# Patient Record
Sex: Male | Born: 1950 | Race: White | Hispanic: No | Smoking: Former smoker
Health system: Southern US, Community
[De-identification: ages and names within clinical notes are randomized; demographics above are authoritative.]

## PROBLEM LIST (undated history)

## (undated) DIAGNOSIS — N400 Enlarged prostate without lower urinary tract symptoms: Secondary | ICD-10-CM

## (undated) DIAGNOSIS — C099 Malignant neoplasm of tonsil, unspecified: Secondary | ICD-10-CM

## (undated) HISTORY — PX: HERNIA REPAIR: SHX51

---

## 2014-09-30 ENCOUNTER — Encounter (HOSPITAL_BASED_OUTPATIENT_CLINIC_OR_DEPARTMENT_OTHER): Payer: Self-pay

## 2014-09-30 ENCOUNTER — Emergency Department (HOSPITAL_BASED_OUTPATIENT_CLINIC_OR_DEPARTMENT_OTHER): Payer: Self-pay

## 2014-09-30 ENCOUNTER — Emergency Department (HOSPITAL_BASED_OUTPATIENT_CLINIC_OR_DEPARTMENT_OTHER)
Admission: EM | Admit: 2014-09-30 | Discharge: 2014-09-30 | Disposition: A | Discharge status: Home / Self Care | Payer: Self-pay | Attending: Emergency Medicine | Admitting: Emergency Medicine

## 2014-09-30 DIAGNOSIS — R220 Localized swelling, mass and lump, head: Secondary | ICD-10-CM

## 2014-09-30 DIAGNOSIS — J358 Other chronic diseases of tonsils and adenoids: Secondary | ICD-10-CM | POA: Insufficient documentation

## 2014-09-30 DIAGNOSIS — R221 Localized swelling, mass and lump, neck: Secondary | ICD-10-CM | POA: Insufficient documentation

## 2014-09-30 DIAGNOSIS — Z720 Tobacco use: Secondary | ICD-10-CM | POA: Insufficient documentation

## 2014-09-30 HISTORY — DX: Benign prostatic hyperplasia without lower urinary tract symptoms: N40.0

## 2014-09-30 LAB — CBC WITH DIFFERENTIAL/PLATELET
Basophils Absolute: 0 10*3/uL (ref 0.0–0.1)
Basophils Relative: 0 % (ref 0–1)
Eosinophils Absolute: 0.1 10*3/uL (ref 0.0–0.7)
Eosinophils Relative: 1 % (ref 0–5)
HCT: 45.5 % (ref 39.0–52.0)
Hemoglobin: 15 g/dL (ref 13.0–17.0)
LYMPHS PCT: 27 % (ref 12–46)
Lymphs Abs: 2.1 10*3/uL (ref 0.7–4.0)
MCH: 29.9 pg (ref 26.0–34.0)
MCHC: 33 g/dL (ref 30.0–36.0)
MCV: 90.8 fL (ref 78.0–100.0)
Monocytes Absolute: 0.6 10*3/uL (ref 0.1–1.0)
Monocytes Relative: 8 % (ref 3–12)
NEUTROS ABS: 5 10*3/uL (ref 1.7–7.7)
Neutrophils Relative %: 64 % (ref 43–77)
PLATELETS: 217 10*3/uL (ref 150–400)
RBC: 5.01 MIL/uL (ref 4.22–5.81)
RDW: 13.5 % (ref 11.5–15.5)
WBC: 7.9 10*3/uL (ref 4.0–10.5)

## 2014-09-30 LAB — BASIC METABOLIC PANEL
Anion gap: 5 (ref 5–15)
BUN: 15 mg/dL (ref 6–23)
CO2: 29 mmol/L (ref 19–32)
Calcium: 9 mg/dL (ref 8.4–10.5)
Chloride: 107 mmol/L (ref 96–112)
Creatinine, Ser: 0.81 mg/dL (ref 0.50–1.35)
GFR calc Af Amer: >90 mL/min (ref >90)
Glucose, Bld: 111 mg/dL — ABNORMAL HIGH (ref 70–99)
POTASSIUM: 3.8 mmol/L (ref 3.5–5.1)
SODIUM: 141 mmol/L (ref 135–145)

## 2014-09-30 MED ORDER — IOHEXOL 300 MG/ML  SOLN
75.0000 mL | Freq: Once | INTRAMUSCULAR | Status: AC | PRN
  Administered 2014-09-30: 75 mL via INTRAVENOUS

## 2014-09-30 NOTE — ED Notes (Signed)
PA at bedside discussing results with patient

## 2014-09-30 NOTE — ED Provider Notes (Signed)
CSN: [id number]     Arrival date & time 09/30/14  1013 History   First MD Initiated Contact with Patient 09/30/14 1036     Chief Complaint  Patient presents with  . Neck Pain     (Consider location/radiation/quality/duration/timing/severity/associated sxs/prior Treatment) The history is provided by the patient.   Nathaniel Wells is a 64 y.o. male presenting with non painfull swelling to the right side of his neck which he first noticed about 3 weeks ago.  He has a mild right sided sore throat as well with swallowing that has responded to tylenol.  He denies fevers, chills, difficulty swallowing, cough, shortness of breath or chest pain.  He denies past medical history, but states he has not seen a doctor in over 8 years.  He denies surgical history.      Past Medical History  Diagnosis Date  . Enlarged prostate    History reviewed. No pertinent past surgical history. No family history on file. History  Substance Use Topics  . Smoking status: Current Every Day Smoker  . Smokeless tobacco: Not on file  . Alcohol Use: No    Review of Systems  Constitutional: Negative for fever and chills.  HENT: Positive for sore throat. Negative for congestion, facial swelling and voice change.   Eyes: Negative.   Respiratory: Negative for chest tightness and shortness of breath.   Cardiovascular: Negative for chest pain.  Gastrointestinal: Negative for nausea and abdominal pain.  Genitourinary: Negative.   Musculoskeletal: Negative for joint swelling and arthralgias.       Negative except as mentioned in HPI.    Skin: Negative.  Negative for rash and wound.  Neurological: Negative for dizziness, weakness, light-headedness, numbness and headaches.  Psychiatric/Behavioral: Negative.       Allergies  Review of patient's allergies indicates no known allergies.  Home Medications   Prior to Admission medications   Not on File   BP 159/100 mmHg  Pulse 61  Temp(Src) 97.6 F (36.4 C)  (Oral)  Resp 14  Ht 5\' 10"  (1.778 m)  Wt 144 lb (65.318 kg)  BMI 20.66 kg/m2  SpO2 100% Physical Exam  Constitutional: He is oriented to person, place, and time. He appears well-developed and well-nourished.  HENT:  Head: Normocephalic and atraumatic.  Right Ear: Tympanic membrane, external ear and ear canal normal.  Left Ear: Tympanic membrane, external ear and ear canal normal.  Nose: No mucosal edema or rhinorrhea.  Mouth/Throat: Uvula is midline and mucous membranes are normal. Posterior oropharyngeal edema present. No oropharyngeal exudate, posterior oropharyngeal erythema or tonsillar abscesses.  Right palatine tonsil slightly enlarged, no erythema or exudate.  Poor dentition, no sign of infection or abscess.  Sublingual space is soft.    Eyes: Conjunctivae are normal.  Cardiovascular: Normal rate and normal heart sounds.   Pulmonary/Chest: Effort normal. No respiratory distress. He has no wheezes. He has no rales.  Abdominal: Soft. There is no tenderness.  Musculoskeletal: Normal range of motion.  Lymphadenopathy:  Right tonsilar lymph node is enlarged, firm and fixed.  Neurological: He is alert and oriented to person, place, and time.  Skin: Skin is warm and dry. No rash noted.  Psychiatric: He has a normal mood and affect.    ED Course  Procedures (including critical care time) Labs Review Labs Reviewed  BASIC METABOLIC PANEL - Abnormal; Notable for the following:    Glucose, Bld 111 (*)    All other components within normal limits  CBC WITH DIFFERENTIAL/PLATELET  Imaging Review No results found.   EKG Interpretation None      MDM   Final diagnoses:  Neck mass  Tonsillar mass    Patients labs and/or radiological studies were reviewed and considered during the medical decision making and disposition process.  Results were also discussed with patient. Ct suggesting right tonsillar carcinoma.  Discussed this result with patient.  Call placed to [facility]  - on call physician Dr Rowe Clack.  Suggested call to the ent dept for scheduling close f/u.  Ms. Michel Harrow with the ENT dept will schedule appt for pt with first avail provider, no later than 5/12, but will attempt sooner f/u if possible.  Pt was given followup information including phone # [phone] - advised pt to call if he has not received a phone call within 48 hours.      Evalee Jefferson, PA-C 10/02/14 2135  Fredia Sorrow, MD 10/03/14 [phone]

## 2014-09-30 NOTE — ED Notes (Signed)
Ms. Nathaniel Wells is calling back from [facility] with a date and time to Nathaniel Wells [phone].

## 2014-09-30 NOTE — ED Notes (Signed)
Pt transported to and from CT.

## 2014-09-30 NOTE — ED Notes (Signed)
Reports swelling to right side of neck. Reports taking Tylenol with some relief. Sts swelling has gotten worse. Denies difficulty breathing and swallowing.

## 2014-09-30 NOTE — ED Notes (Signed)
PA at bedside.

## 2014-10-08 DIAGNOSIS — C099 Malignant neoplasm of tonsil, unspecified: Secondary | ICD-10-CM | POA: Diagnosis present

## 2014-10-08 HISTORY — DX: Malignant neoplasm of tonsil, unspecified: C09.9

## 2014-10-09 DIAGNOSIS — J439 Emphysema, unspecified: Secondary | ICD-10-CM | POA: Diagnosis present

## 2014-12-05 DIAGNOSIS — K219 Gastro-esophageal reflux disease without esophagitis: Secondary | ICD-10-CM | POA: Diagnosis present

## 2016-05-07 DIAGNOSIS — Z931 Gastrostomy status: Secondary | ICD-10-CM

## 2017-05-09 DIAGNOSIS — E7800 Pure hypercholesterolemia, unspecified: Secondary | ICD-10-CM | POA: Diagnosis present

## 2017-05-09 DIAGNOSIS — I2510 Atherosclerotic heart disease of native coronary artery without angina pectoris: Secondary | ICD-10-CM | POA: Diagnosis present

## 2017-12-27 DIAGNOSIS — Z87891 Personal history of nicotine dependence: Secondary | ICD-10-CM

## 2018-05-01 ENCOUNTER — Emergency Department (HOSPITAL_COMMUNITY): Payer: Medicare Other

## 2018-05-01 ENCOUNTER — Encounter (HOSPITAL_COMMUNITY): Payer: Self-pay | Admitting: Internal Medicine

## 2018-05-01 ENCOUNTER — Observation Stay (HOSPITAL_COMMUNITY)
Admission: EM | Admit: 2018-05-01 | Discharge: 2018-05-02 | Disposition: A | Discharge status: Home / Self Care | Payer: Medicare Other | Attending: Family Medicine | Admitting: Family Medicine

## 2018-05-01 DIAGNOSIS — Z79890 Hormone replacement therapy (postmenopausal): Secondary | ICD-10-CM | POA: Diagnosis not present

## 2018-05-01 DIAGNOSIS — E7800 Pure hypercholesterolemia, unspecified: Secondary | ICD-10-CM | POA: Diagnosis not present

## 2018-05-01 DIAGNOSIS — R42 Dizziness and giddiness: Secondary | ICD-10-CM

## 2018-05-01 DIAGNOSIS — I2510 Atherosclerotic heart disease of native coronary artery without angina pectoris: Secondary | ICD-10-CM | POA: Diagnosis present

## 2018-05-01 DIAGNOSIS — J439 Emphysema, unspecified: Secondary | ICD-10-CM | POA: Diagnosis present

## 2018-05-01 DIAGNOSIS — Z931 Gastrostomy status: Secondary | ICD-10-CM

## 2018-05-01 DIAGNOSIS — H6990 Unspecified Eustachian tube disorder, unspecified ear: Secondary | ICD-10-CM | POA: Insufficient documentation

## 2018-05-01 DIAGNOSIS — C099 Malignant neoplasm of tonsil, unspecified: Secondary | ICD-10-CM | POA: Diagnosis present

## 2018-05-01 DIAGNOSIS — Z8589 Personal history of malignant neoplasm of other organs and systems: Secondary | ICD-10-CM | POA: Insufficient documentation

## 2018-05-01 DIAGNOSIS — E039 Hypothyroidism, unspecified: Secondary | ICD-10-CM | POA: Insufficient documentation

## 2018-05-01 DIAGNOSIS — H6980 Other specified disorders of Eustachian tube, unspecified ear: Secondary | ICD-10-CM | POA: Insufficient documentation

## 2018-05-01 DIAGNOSIS — K219 Gastro-esophageal reflux disease without esophagitis: Secondary | ICD-10-CM | POA: Diagnosis not present

## 2018-05-01 DIAGNOSIS — E785 Hyperlipidemia, unspecified: Secondary | ICD-10-CM | POA: Insufficient documentation

## 2018-05-01 DIAGNOSIS — Z79899 Other long term (current) drug therapy: Secondary | ICD-10-CM | POA: Diagnosis not present

## 2018-05-01 DIAGNOSIS — J449 Chronic obstructive pulmonary disease, unspecified: Secondary | ICD-10-CM | POA: Diagnosis not present

## 2018-05-01 DIAGNOSIS — T68XXXA Hypothermia, initial encounter: Secondary | ICD-10-CM | POA: Diagnosis present

## 2018-05-01 DIAGNOSIS — R4182 Altered mental status, unspecified: Secondary | ICD-10-CM | POA: Diagnosis present

## 2018-05-01 DIAGNOSIS — R072 Precordial pain: Secondary | ICD-10-CM

## 2018-05-01 DIAGNOSIS — Z87891 Personal history of nicotine dependence: Secondary | ICD-10-CM

## 2018-05-01 DIAGNOSIS — Z7982 Long term (current) use of aspirin: Secondary | ICD-10-CM | POA: Insufficient documentation

## 2018-05-01 DIAGNOSIS — R680 Hypothermia, not associated with low environmental temperature: Secondary | ICD-10-CM | POA: Diagnosis not present

## 2018-05-01 DIAGNOSIS — R0602 Shortness of breath: Secondary | ICD-10-CM | POA: Diagnosis present

## 2018-05-01 DIAGNOSIS — F17200 Nicotine dependence, unspecified, uncomplicated: Secondary | ICD-10-CM | POA: Insufficient documentation

## 2018-05-01 HISTORY — DX: Malignant neoplasm of tonsil, unspecified: C09.9

## 2018-05-01 LAB — TROPONIN I: Troponin I: <0.03 ng/mL (ref <0.03)

## 2018-05-01 LAB — COMPREHENSIVE METABOLIC PANEL
ALBUMIN: 4.3 g/dL (ref 3.5–5.0)
ALK PHOS: 66 U/L (ref 38–126)
ALT: 30 U/L (ref 0–44)
AST: 37 U/L (ref 15–41)
Anion gap: 11 (ref 5–15)
BUN: 17 mg/dL (ref 8–23)
CO2: 25 mmol/L (ref 22–32)
Calcium: 9.2 mg/dL (ref 8.9–10.3)
Chloride: 102 mmol/L (ref 98–111)
Creatinine, Ser: 0.86 mg/dL (ref 0.61–1.24)
GFR calc Af Amer: >60 mL/min (ref >60)
GFR calc non Af Amer: >60 mL/min (ref >60)
GLUCOSE: 148 mg/dL — AB (ref 70–99)
Potassium: 3.4 mmol/L — ABNORMAL LOW (ref 3.5–5.1)
SODIUM: 138 mmol/L (ref 135–145)
Total Bilirubin: 0.3 mg/dL (ref 0.3–1.2)
Total Protein: 7 g/dL (ref 6.5–8.1)

## 2018-05-01 LAB — CBC
HCT: 39.5 % (ref 39.0–52.0)
HEMATOCRIT: 40.8 % (ref 39.0–52.0)
HEMOGLOBIN: 12.8 g/dL — AB (ref 13.0–17.0)
Hemoglobin: 12.2 g/dL — ABNORMAL LOW (ref 13.0–17.0)
MCH: 30 pg (ref 26.0–34.0)
MCH: 30.6 pg (ref 26.0–34.0)
MCHC: 31.4 g/dL (ref 30.0–36.0)
MCHC: 30.9 g/dL (ref 30.0–36.0)
MCV: 95.6 fL (ref 80.0–100.0)
MCV: 99 fL (ref 80.0–100.0)
Platelets: 168 10*3/uL (ref 150–400)
Platelets: 149 10*3/uL — ABNORMAL LOW (ref 150–400)
RBC: 4.27 MIL/uL (ref 4.22–5.81)
RBC: 3.99 MIL/uL — ABNORMAL LOW (ref 4.22–5.81)
RDW: 13 % (ref 11.5–15.5)
RDW: 13 % (ref 11.5–15.5)
WBC: 7 10*3/uL (ref 4.0–10.5)
WBC: 13.8 10*3/uL — ABNORMAL HIGH (ref 4.0–10.5)
nRBC: 0 % (ref 0.0–0.2)
nRBC: 0 % (ref 0.0–0.2)

## 2018-05-01 LAB — GLUCOSE, CAPILLARY: Glucose-Capillary: 96 mg/dL (ref 70–99)

## 2018-05-01 LAB — RAPID URINE DRUG SCREEN, HOSP PERFORMED
Amphetamines: NOT DETECTED
Barbiturates: NOT DETECTED
Benzodiazepines: NOT DETECTED
Cocaine: NOT DETECTED
Opiates: NOT DETECTED
Tetrahydrocannabinol: POSITIVE — AB

## 2018-05-01 LAB — URINALYSIS, ROUTINE W REFLEX MICROSCOPIC
Bilirubin Urine: NEGATIVE
Glucose, UA: NEGATIVE mg/dL
Hgb urine dipstick: NEGATIVE
Ketones, ur: NEGATIVE mg/dL
Leukocytes, UA: NEGATIVE
Nitrite: NEGATIVE
Protein, ur: NEGATIVE mg/dL
Specific Gravity, Urine: 1.013 (ref 1.005–1.030)
pH: 8 (ref 5.0–8.0)

## 2018-05-01 LAB — LIPASE, BLOOD: Lipase: 29 U/L (ref 11–51)

## 2018-05-01 LAB — SALICYLATE LEVEL: Salicylate Lvl: <7 mg/dL (ref 2.8–30.0)

## 2018-05-01 LAB — TSH: TSH: 3.554 u[IU]/mL (ref 0.350–4.500)

## 2018-05-01 LAB — I-STAT TROPONIN, ED: TROPONIN I, POC: 0 ng/mL (ref 0.00–0.08)

## 2018-05-01 LAB — T4, FREE: Free T4: 0.89 ng/dL (ref 0.82–1.77)

## 2018-05-01 LAB — I-STAT CG4 LACTIC ACID, ED
Lactic Acid, Venous: 1.64 mmol/L (ref 0.5–1.9)
Lactic Acid, Venous: 1.49 mmol/L (ref 0.5–1.9)

## 2018-05-01 LAB — INFLUENZA PANEL BY PCR (TYPE A & B)
Influenza A By PCR: NEGATIVE
Influenza B By PCR: NEGATIVE

## 2018-05-01 MED ORDER — ADULT MULTIVITAMIN W/MINERALS CH
1.0000 | ORAL_TABLET | Freq: Every day | ORAL | Status: DC
  Administered 2018-05-02: 1 via ORAL
  Filled 2018-05-01: qty 1

## 2018-05-01 MED ORDER — SODIUM CHLORIDE 0.9 % IV BOLUS
500.0000 mL | Freq: Once | INTRAVENOUS | Status: AC
  Administered 2018-05-01: 500 mL via INTRAVENOUS

## 2018-05-01 MED ORDER — UMECLIDINIUM BROMIDE 62.5 MCG/INH IN AEPB
1.0000 | INHALATION_SPRAY | Freq: Every day | RESPIRATORY_TRACT | Status: DC
  Administered 2018-05-02: 1 via RESPIRATORY_TRACT
  Filled 2018-05-01: qty 7

## 2018-05-01 MED ORDER — IPRATROPIUM-ALBUTEROL 0.5-2.5 (3) MG/3ML IN SOLN: 3.0000 mL | Freq: Four times a day (QID) | RESPIRATORY_TRACT | Status: DC | PRN

## 2018-05-01 MED ORDER — PANTOPRAZOLE SODIUM 40 MG PO TBEC
40.0000 mg | DELAYED_RELEASE_TABLET | Freq: Two times a day (BID) | ORAL | Status: DC
  Administered 2018-05-01 – 2018-05-02 (×2): 40 mg via ORAL
  Filled 2018-05-01 (×2): qty 1

## 2018-05-01 MED ORDER — IPRATROPIUM-ALBUTEROL 0.5-2.5 (3) MG/3ML IN SOLN
3.0000 mL | Freq: Once | RESPIRATORY_TRACT | Status: AC
  Administered 2018-05-01: 3 mL via RESPIRATORY_TRACT
  Filled 2018-05-01: qty 3

## 2018-05-01 MED ORDER — ENOXAPARIN SODIUM 40 MG/0.4ML ~~LOC~~ SOLN
40.0000 mg | Freq: Every day | SUBCUTANEOUS | Status: DC
  Administered 2018-05-01: 40 mg via SUBCUTANEOUS
  Filled 2018-05-01: qty 0.4

## 2018-05-01 MED ORDER — ONDANSETRON HCL 4 MG/2ML IJ SOLN: 4.0000 mg | Freq: Four times a day (QID) | INTRAMUSCULAR | Status: DC | PRN

## 2018-05-01 MED ORDER — ONDANSETRON HCL 4 MG/2ML IJ SOLN
4.0000 mg | Freq: Once | INTRAMUSCULAR | Status: AC | PRN
  Administered 2018-05-01: 4 mg via INTRAVENOUS
  Filled 2018-05-01: qty 2

## 2018-05-01 MED ORDER — OSMOLITE 1.5 CAL PO LIQD
474.0000 mL | Freq: Three times a day (TID) | ORAL | Status: DC
  Filled 2018-05-01 (×4): qty 474

## 2018-05-01 MED ORDER — SIMVASTATIN 20 MG PO TABS
40.0000 mg | ORAL_TABLET | Freq: Every evening | ORAL | Status: DC
  Administered 2018-05-01: 40 mg via ORAL
  Filled 2018-05-01: qty 1
  Filled 2018-05-01: qty 2

## 2018-05-01 MED ORDER — ASPIRIN 81 MG PO CHEW
81.0000 mg | CHEWABLE_TABLET | Freq: Every day | ORAL | Status: DC
  Filled 2018-05-01: qty 1

## 2018-05-01 MED ORDER — ALBUTEROL SULFATE (2.5 MG/3ML) 0.083% IN NEBU: 3.0000 mL | INHALATION_SOLUTION | RESPIRATORY_TRACT | Status: DC | PRN

## 2018-05-01 MED ORDER — PILOCARPINE HCL 5 MG PO TABS
7.5000 mg | ORAL_TABLET | Freq: Three times a day (TID) | ORAL | Status: DC
  Administered 2018-05-01 – 2018-05-02 (×3): 7.5 mg via ORAL
  Filled 2018-05-01 (×4): qty 2

## 2018-05-01 MED ORDER — TIOTROPIUM BROMIDE MONOHYDRATE 18 MCG IN CAPS
18.0000 ug | ORAL_CAPSULE | Freq: Every day | RESPIRATORY_TRACT | Status: DC
  Filled 2018-05-01: qty 5

## 2018-05-01 MED ORDER — LEVOTHYROXINE SODIUM 25 MCG PO TABS
25.0000 ug | ORAL_TABLET | Freq: Every day | ORAL | Status: DC
  Administered 2018-05-01: 25 ug via ORAL
  Filled 2018-05-01 (×2): qty 1

## 2018-05-01 MED ORDER — FLUOCINONIDE 0.05 % EX SOLN
1.0000 "application " | Freq: Two times a day (BID) | CUTANEOUS | Status: DC | PRN
  Filled 2018-05-01: qty 60

## 2018-05-01 NOTE — ED Notes (Signed)
Per hospitalist, keep patient on bear hugger medium setting. Pt reports that he feels better on this higher setting. Admit status changed from observation to stepdown to accommodate bear hugger and 96.7 rectal temperature.

## 2018-05-01 NOTE — ED Notes (Signed)
Patient transported to X-ray 

## 2018-05-01 NOTE — H&P (Signed)
History and Physical    Nathaniel Wells UMP:[id number] DOB: 26-May-1951 DOA: 05/01/2018  PCP: No primary care provider on file. Consultants: None Patient coming from: Home- lives alone  Chief Complaint: Vertigo  HPI: Nathaniel Wells is a 67 y.o. 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  Patient states that when he woke up this morning he was feeling in his usual state of health.  He got in his car to drive to see his daughter at work and states that while he was on the way he began to feel vertigo and general malaise.  He states that about every 6 months for the last 10 or 15 years he gets a spell of vertigo that lasts anywhere from 2 to 6 hours that is relieved with lying in bed and resting.  This felt similar to those episodes.  However, when his daughter saw that his car had arrived in the parking lot she went out to see him and found him lying back reclined in his seat and thought he was asleep.  When she opened the door he was awake but had decreased alertness, breathing hard, "his eyes were not focused" and he reportedly was complaining of chest pain.  Patient does not recall complaining of any chest pain.  He has not had any shortness of breath.  He does not appear to remember the details of what happened when his daughter came out to the car.  He has not had any fevers or chills at home.  Although his daughter states that he feels warm on occasion.  He is G-tube-dependent for his nutrition but is able to drink fluids.  He states that he is compliant with all his medications.  He quit smoking several years ago but still smokes marijuana according to his daughter.  She states that he started smoking marijuana around the time of his cancer treatment and  she does not believe he uses it excessively.  He does not drink any alcohol.  He takes a baby aspirin daily.  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  This was felt to be the cause of his vertigo.   ED Course: Rectal temperature was 96.4, O2 sats 97% on room air, blood pressure 133/68, heart rate 48 on arrival which improved to 50s to 70s.  Respiratory rate 14-23.  When patient arrived in the ED he was having rigors, complaining of being cold.  Vertigo resolved by the time of my exam.  He reported no pain.  Lab work was unremarkable.  Lactate was within normal limits.  Head CT showed nothing acute, chest x-ray was unremarkable.  Blood cultures were sent. Bair Hugger was placed which pt stated made him feel better, and he became more alert. However repeat rectal temp was 96.7.   Review of Systems: As per HPI; otherwise review of systems reviewed and negative.   Ambulatory Status:  Ambulates without assistance  Past Medical History:  Diagnosis Date  . Enlarged prostate     History reviewed. No pertinent surgical history.  Social History   Socioeconomic History  . Marital status: Unknown    Spouse name: Not on file  . Number  of children: Not on file  . Years of education: Not on file  . Highest education level: Not on file  Occupational History  . Not on file  Social Needs  . Financial resource strain: Not on file  . Food insecurity:    Worry: Not on file    Inability: Not on file  . Transportation needs:    Medical: Not on file    Non-medical: Not on file  Tobacco Use  . Smoking status: Current Every Day Smoker  Substance and Sexual Activity  . Alcohol use: No  . Drug use: Not on file  . Sexual activity: Not on file  Lifestyle  . Physical activity:    Days per week: Not on file    Minutes per session: Not on  file  . Stress: Not on file  Relationships  . Social connections:    Talks on phone: Not on file    Gets together: Not on file    Attends religious service: Not on file    Active member of club or organization: Not on file    Attends meetings of clubs or organizations: Not on file    Relationship status: Not on file  . Intimate partner violence:    Fear of current or ex partner: Not on file    Emotionally abused: Not on file    Physically abused: Not on file    Forced sexual activity: Not on file  Other Topics Concern  . Not on file  Social History Narrative  . Not on file    No Known Allergies  No family history on file.  Prior to Admission medications   Medication Sig Start Date End Date Taking? Authorizing Provider  albuterol (PROVENTIL HFA;VENTOLIN HFA) 108 (90 Base) MCG/ACT inhaler Inhale 2 puffs into the lungs every 4 (four) hours as needed for wheezing or shortness of breath. 04/05/18  Yes [provider]  aspirin 81 MG chewable tablet Chew 81 mg by mouth daily. 05/09/17  Yes [provider]  esomeprazole (NEXIUM) 40 MG capsule Take 40 mg by mouth 2 (two) times daily. 03/23/18  Yes [provider]  fluocinonide (LIDEX) 0.05 % external solution Apply 1 application topically 2 (two) times daily as needed. For flare ups 03/31/18  Yes [provider]  levothyroxine (SYNTHROID, LEVOTHROID) 25 MCG tablet Take 25 mcg by mouth daily. 05/09/17  Yes [provider]  Multiple Vitamin (MULTIVITAMIN WITH MINERALS) TABS tablet Take 1 tablet by mouth daily.   Yes [provider]  pilocarpine (SALAGEN) 7.5 MG tablet Take 7.5 mg by mouth 3 (three) times daily.  04/26/18  Yes [provider]  simvastatin (ZOCOR) 40 MG tablet Take 40 mg by mouth every evening. 04/28/18  Yes [provider]  tiotropium (SPIRIVA) 18 MCG inhalation capsule Place 18 mcg into inhaler and inhale daily.   Yes [provider]    Physical  Exam: Vitals:   05/01/18 1515 05/01/18 1530 05/01/18 1545 05/01/18 1600  BP: (!) 135/56  134/90 140/65  Pulse: (!) 51 (!) 56 (!) 51 71  Resp: 14 17 19  (!) 23  Temp:      TempSrc:      SpO2: 99% 100% 98% 100%     . General: Appears calm and comfortable and is in NAD. Bair Hugger in place. Alert and oriented x 4. . Eyes:  PERRL, EOMI, normal lids, iris . ENT:  grossly normal hearing, lips & tongue. MMM. . Neck:  supple, no lymphadenopathy .  Cardiovascular:  nL S1, S2, normal rate, reg rhythm, no murmur. Marland Kitchen Respiratory:   CTA bilaterally with no wheezes/rales/rhonchi.  Normal respiratory effort. . Abdomen:  soft, NT, ND, NABS . Back:   grossly normal alignment . Skin:  no rash or lesions seen on limited exam . Musculoskeletal: Muscle atrophy BUE/BLE, good ROM, no bony abnormality or obvious joint deformity . Lower extremities:  No LE edema.  Limited foot exam with no ulcerations.  2+ distal pulses. Marland Kitchen Psychiatric:  grossly normal mood and affect, speech fluent and appropriate, AOx3 . Neurologic:  CN 2-12 grossly intact, moves all extremities in coordinated fashion, sensation intact, Patellar DTRs 2+ and symmetric    Radiological Exams on Admission: Dg Chest 2 View  Result Date: 05/01/2018 CLINICAL DATA:  Shortness of breath and dizziness. EXAM: CHEST - 2 VIEW COMPARISON:  None. FINDINGS: Cardiomediastinal silhouette is normal. Mediastinal contours appear intact. Calcific atherosclerotic disease and tortuosity of the aorta. There is no evidence of focal airspace consolidation, pleural effusion or pneumothorax. Biapical subpleural thickening. Osseous structures are without acute abnormality. Soft tissues are grossly normal. IMPRESSION: No evidence of pulmonary edema or infiltrate. Biapical subpleural thickening, right greater than left. Given patient's history of smoking, further evaluation with CT of the chest may be considered on a nonemergent basis. Calcific atherosclerotic disease of the  aorta. Electronically Signed   By: Fidela Salisbury M.D.   On: 05/01/2018 15:51   Ct Head Wo Contrast  Result Date: 05/01/2018 CLINICAL DATA:  Altered mental status, dizziness. EXAM: CT HEAD WITHOUT CONTRAST TECHNIQUE: Contiguous axial images were obtained from the base of the skull through the vertex without intravenous contrast. COMPARISON:  None. FINDINGS: Brain: There is mild generalized parenchymal volume loss with commensurate dilatation of the ventricles and sulci. Mild chronic small vessel ischemic changes noted within the periventricular white matter regions bilaterally. There is no mass, hemorrhage, edema or other evidence of acute parenchymal abnormality. No extra-axial hemorrhage. Vascular: Chronic calcified atherosclerotic changes of the large vessels at the skull base. No unexpected hyperdense vessel. Skull: Normal. Negative for fracture or focal lesion. Sinuses/Orbits: No acute finding. Other: None. IMPRESSION: 1. No acute findings. No intracranial mass, hemorrhage or edema. 2. Mild chronic small vessel ischemic changes in the white matter. Electronically Signed   By: Franki Cabot M.D.   On: 05/01/2018 16:11    EKG: Independently reviewed.    EKG Interpretation  Date/Time:                        Monday May 01 2018 14:37:58 EST Ventricular Rate:   54 PR Interval:                        QRS Duration:        90 QT Interval:                      498 QTC Calculation:    472 R Axis:                         80 Text Interpretation:  Sinus rhythm Atrial premature complex No STEMI. No old tracing for comparison.  Confirmed by Nanda Quinton [phone]) on 05/01/2018 3:32:49 PM Also confirmed by Nanda Quinton [phone]), editor Philomena Doheny [phone])  on 05/01/2018 3:35:04 PM    Labs on Admission: I have personally reviewed the available labs and imaging studies at the time of the  admission.  Pertinent labs:  Sodium 138 potassium 3.4 chloride 102 CO2 25 glucose 148 BUN 17 creatinine 0.86  which is his baseline, calcium 9.2, LFTs within normal limits Troponin 0.00 Lactic acid 1.64 WBC 7.0, hemoglobin 12.8, platelets 168 U/A ordered, pending collection  Assessment/Plan Active Problems:   Atherosclerosis of native coronary artery of native heart without angina pectoris   Emphysema of lung (HCC)   Feeding by G-tube ([place])   Gastroesophageal reflux disease without esophagitis   Former heavy tobacco smoker   Squamous cell carcinoma of right tonsil (HCC)   Hypercholesterolemia    Hypothermia: mild, no other s/s sepsis. CXR clear, no evidence of PNA. Urine has not been collected, ?UTI?  Alternatively, could be 2/2 hyopthyroidism, malnutrition, thiamine deficiency, hypoglycemia (the latter less likely as pt has normal glucose here); he does have a h/o high/normal TSH / undertreated hypothyroidism in the past. He is not on any meds typically associated with hypothermia. -cont Bair Hugger, frequent vitals -admit to stepdown unit as pt requiring Bair Hugger -check TSH, FT4 -check urine drug screen, aspirin level, thiamine level -accuchecks to r/o hypoglycemia -f/u U/A  Vertigo: likely related to his chronic eustachian tube dysfunction, question Meniere's given concomitant nausea and h/o SNHL. -meclizine prn -currently resolved -no evidence of CVA   CAD: stable. No evidence of ACS. Tropnin negative x 1. EKG no ischemia -cycle troponin, EKG in a.m. -continue   Hypothyroidism: as above -check TSH, FT4 -cont synthroid  COPD -cont albuterol nebs prn, Ellipta   Hyperlipidemia: cont home statin  History of squamous cell carcinoma of the tonsil, G-tube dependent -osmolite 1.5 2 cans TID -pt is able to drink liquids -cont outpatient followup  GERD: cont PPI  DVT prophylaxis: lovenox [place] Code Status:  Full - confirmed with patient/family Family Communication: daughters at bedside  Disposition Plan:  Home once clinically improved Consults called: none  Admission status:  Admit - It is my clinical opinion that admission to INPATIENT is reasonable and necessary because of the expectation that this patient will require hospital care that crosses at least 2 midnights to treat this condition based on the medical complexity of the problems presented.  Given the aforementioned information, the predictability of an adverse outcome is felt to be significant.     Janora Norlander MD [facility]  If note is complete, please contact covering daytime or nighttime physician. [email or web address] Password [email or web address]  05/01/2018, 5:25 PM

## 2018-05-01 NOTE — ED Notes (Signed)
Patient given warm blankets. Pt also had 1 more episode of vomiting with this RN.

## 2018-05-01 NOTE — ED Notes (Signed)
Patient placed on bear hugger blanket d/t 96.4 rectal temperature.

## 2018-05-01 NOTE — ED Notes (Signed)
Hospitalist at the bedside 

## 2018-05-01 NOTE — ED Triage Notes (Addendum)
Pt here after daughter called EMS from [employer] parking lot due to pt's shortness of breath and dizziness. Lung sounds clear for EMS. Pt had episode of emesis (266mls of osmolite). States he had some chest pain initially that has subsided now. Hx vertigo, feeding tube, throat cancer, and COPD. 97% on RA for EMS. Pt able to speak in full sentences without difficulty.

## 2018-05-01 NOTE — ED Provider Notes (Signed)
Emergency Department Provider Note   I have reviewed the triage vital signs and the nursing notes.   HISTORY  Chief Complaint Shortness of Breath   HPI Nathaniel Wells is a 67 y.o. male with PMH of BPH, COPD and vertigo resents to the emergency department for evaluation of shortness of breath, chest tightness, lightheadedness/vertigo symptoms.  The patient's daughter works at [employer].  The patient was driving to visit her at work.  States he is having some mild shortness of breath prior to leaving home.  He arrived to the store and parked his car.  He felt sudden onset lightheadedness/vertigo symptoms which are typical of his prior vertigo but had associated shortness of breath and chest heaviness which is atypical.  His daughter knew he was coming to visit and walked out to see his car parked there.  When she hooked up to the car he was laying down in the car and shivering.  He is complaining of shortness of breath and chest pressure.  Patient states that many of the symptoms have resolved at this point.  He has been feeling okay up until this point in the day.  No active chest pressure but continues to have some shortness of breath.  He does not take medication for vertigo.  Denies any weakness or numbness.  Patient did have 2 episodes of vomiting but denies abdominal pain.    Past Medical History:  Diagnosis Date  . Enlarged prostate   . 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 Eustachian tube dysfunction 05/01/2018  . Vertigo 05/01/2018  . Hypothermia 05/01/2018  . Former heavy tobacco smoker 12/27/2017  . Atherosclerosis of native coronary artery of native heart without angina pectoris 05/09/2017  . Hypercholesterolemia 05/09/2017  . Feeding by G-tube ([place]) 05/07/2016  . Gastroesophageal reflux disease without esophagitis 12/05/2014  . Emphysema of lung ([place]) 10/09/2014  . Squamous cell carcinoma of right  tonsil ([place]) 10/08/2014    History reviewed. No pertinent surgical history.    Allergies Patient has no known allergies.  No family history on file.  Social History Social History   Tobacco Use  . Smoking status: Current Every Day Smoker  Substance Use Topics  . Alcohol use: No  . Drug use: Not on file    Review of Systems  Constitutional: No fever. Positive chills.  Eyes: No visual changes. ENT: No sore throat. Positive vertigo.  Cardiovascular: Positive chest pain. Respiratory: Positive shortness of breath. Gastrointestinal: No abdominal pain. Positive nausea and vomiting.  No diarrhea.  No constipation. Genitourinary: Negative for dysuria. Musculoskeletal: Negative for back pain. Skin: Negative for rash. Neurological: Negative for headaches, focal weakness or numbness.  10-point ROS otherwise negative.  ____________________________________________   PHYSICAL EXAM:  VITAL SIGNS: ED Triage Vitals  Enc Vitals Group     BP 05/01/18 1431 133/68     Pulse --      Resp --      Temp 05/01/18 1444 (S) (!) 96.4 F (35.8 C)     Temp Source 05/01/18 1444 Rectal     SpO2 05/01/18 1431 97 %     Pain Score 05/01/18 1437 0   Constitutional: Alert and oriented. Appears generally weak but in no acute distress. No AMS.  Eyes: Conjunctivae are normal.  Head: Atraumatic. Nose: No congestion/rhinnorhea. Mouth/Throat: Mucous membranes are dry.  Neck: No stridor. Cardiovascular: Normal rate, regular rhythm. Good peripheral circulation. Grossly normal  heart sounds.   Respiratory: Normal respiratory effort.  No retractions. Lungs with mild bilateral wheezing. No rales or rhonchi.  Gastrointestinal: Soft and nontender. No distention.  Musculoskeletal: No lower extremity tenderness nor edema. No gross deformities of extremities. Neurologic:  Normal speech and language. No gross focal neurologic deficits are appreciated.  Skin:  Skin is warm, dry and intact. No rash  noted.  ____________________________________________   LABS (all labs ordered are listed, but only abnormal results are displayed)  Labs Reviewed  CBC - Abnormal; Notable for the following components:      Result Value   Hemoglobin 12.8 (*)    All other components within normal limits  COMPREHENSIVE METABOLIC PANEL - Abnormal; Notable for the following components:   Potassium 3.4 (*)    Glucose, Bld 148 (*)    All other components within normal limits  URINALYSIS, ROUTINE W REFLEX MICROSCOPIC - Abnormal; Notable for the following components:   APPearance HAZY (*)    All other components within normal limits  RAPID URINE DRUG SCREEN, HOSP PERFORMED - Abnormal; Notable for the following components:   Tetrahydrocannabinol POSITIVE (*)    All other components within normal limits  CBC - Abnormal; Notable for the following components:   WBC 13.8 (*)    RBC 3.99 (*)    Hemoglobin 12.2 (*)    Platelets 149 (*)    All other components within normal limits  CULTURE, BLOOD (ROUTINE X 2)  CULTURE, BLOOD (ROUTINE X 2)  MRSA PCR SCREENING  LIPASE, BLOOD  INFLUENZA PANEL BY PCR (TYPE A & B)  TSH  SALICYLATE LEVEL  HIV ANTIBODY (ROUTINE TESTING W REFLEX)  BASIC METABOLIC PANEL  TROPONIN I  TROPONIN I  T4, FREE  GLUCOSE, CAPILLARY  GLUCOSE, CAPILLARY  VITAMIN B1  I-STAT TROPONIN, ED  I-STAT CG4 LACTIC ACID, ED  I-STAT CG4 LACTIC ACID, ED   ____________________________________________  EKG   EKG Interpretation  Date/Time:  Monday May 01 2018 14:37:58 EST Ventricular Rate:  54 PR Interval:    QRS Duration: 90 QT Interval:  498 QTC Calculation: 472 R Axis:   80 Text Interpretation:  Sinus rhythm Atrial premature complex No STEMI. No old tracing for comparison.  Confirmed by Nanda Quinton [phone]) on 05/01/2018 3:32:49 PM Also confirmed by Nanda Quinton [phone]), editor Philomena Doheny [phone])  on 05/01/2018 3:35:04 PM        ____________________________________________  RADIOLOGY  Dg Chest 2 View  Result Date: 05/01/2018 CLINICAL DATA:  Shortness of breath and dizziness. EXAM: CHEST - 2 VIEW COMPARISON:  None. FINDINGS: Cardiomediastinal silhouette is normal. Mediastinal contours appear intact. Calcific atherosclerotic disease and tortuosity of the aorta. There is no evidence of focal airspace consolidation, pleural effusion or pneumothorax. Biapical subpleural thickening. Osseous structures are without acute abnormality. Soft tissues are grossly normal. IMPRESSION: No evidence of pulmonary edema or infiltrate. Biapical subpleural thickening, right greater than left. Given patient's history of smoking, further evaluation with CT of the chest may be considered on a nonemergent basis. Calcific atherosclerotic disease of the aorta. Electronically Signed   By: Fidela Salisbury M.D.   On: 05/01/2018 15:51   Ct Head Wo Contrast  Result Date: 05/01/2018 CLINICAL DATA:  Altered mental status, dizziness. EXAM: CT HEAD WITHOUT CONTRAST TECHNIQUE: Contiguous axial images were obtained from the base of the skull through the vertex without intravenous contrast. COMPARISON:  None. FINDINGS: Brain: There is mild generalized parenchymal volume loss with commensurate dilatation of the ventricles and sulci. Mild chronic small vessel ischemic  changes noted within the periventricular white matter regions bilaterally. There is no mass, hemorrhage, edema or other evidence of acute parenchymal abnormality. No extra-axial hemorrhage. Vascular: Chronic calcified atherosclerotic changes of the large vessels at the skull base. No unexpected hyperdense vessel. Skull: Normal. Negative for fracture or focal lesion. Sinuses/Orbits: No acute finding. Other: None. IMPRESSION: 1. No acute findings. No intracranial mass, hemorrhage or edema. 2. Mild chronic small vessel ischemic changes in the white matter. Electronically Signed   By: Franki Cabot  M.D.   On: 05/01/2018 16:11    ____________________________________________   PROCEDURES  Procedure(s) performed:   Procedures  None ____________________________________________   INITIAL IMPRESSION / ASSESSMENT AND PLAN / ED COURSE  Pertinent labs & imaging results that were available during my care of the patient were reviewed by me and considered in my medical decision making (see chart for details).  Patient presents to the emergency department for evaluation of generalized weakness, shortness of breath, chest pressure, and vertigo symptoms.  Patient appears somewhat fatigued and tired but is able to provide a full history.  No active chest pain.  No evidence of acute ischemia on EKG.  In the emergency department.  The patient has a low temperature on our initial evaluation.  Nursing report rigors. Will continue warming and f/u with labs and imaging including CT head with acute onset vertigo although the patient does have a vertigo history. Peripheral vertigo would not explain patient's CP/SOB and low core temp.   Labs and imaging reviewed. No infection source at this time. Patient feeling improved. Will admit for obs and CP evaluation with further cardiac monitoring.   Discussed patient's case with Hospitalist to request admission. Patient and family (if present) updated with plan. Care transferred to Hospitalist service.  I reviewed all nursing notes, vitals, pertinent old records, EKGs, labs, imaging (as available).  ____________________________________________  FINAL CLINICAL IMPRESSION(S) / ED DIAGNOSES  Final diagnoses:  Precordial chest pain  Vertigo  SOB (shortness of breath)  Hypothermia, initial encounter     MEDICATIONS GIVEN DURING THIS VISIT:  Medications  ondansetron (ZOFRAN) injection 4 mg (has no administration in time range)  ipratropium-albuterol (DUONEB) 0.5-2.5 (3) MG/3ML nebulizer solution 3 mL (has no administration in time range)  multivitamin  with minerals tablet 1 tablet (1 tablet Oral Given 05/02/18 0809)  albuterol (PROVENTIL) (2.5 MG/3ML) 0.083% nebulizer solution 3 mL (has no administration in time range)  aspirin chewable tablet 81 mg (81 mg Oral Not Given 05/02/18 0808)  pantoprazole (PROTONIX) EC tablet 40 mg (40 mg Oral Given 05/02/18 0809)  fluocinonide (LIDEX) 0.05 % external solution 1 application (has no administration in time range)  levothyroxine (SYNTHROID, LEVOTHROID) tablet 25 mcg (25 mcg Oral Not Given 05/02/18 0930)  pilocarpine (SALAGEN) tablet 7.5 mg (7.5 mg Oral Given 05/02/18 0809)  simvastatin (ZOCOR) tablet 40 mg (40 mg Oral Given 05/01/18 2225)  enoxaparin (LOVENOX) injection 40 mg (40 mg Subcutaneous Given 05/01/18 2237)  feeding supplement (OSMOLITE 1.5 CAL) liquid 474 mL (474 mLs Per Tube Not Given 05/01/18 2231)  umeclidinium bromide (INCRUSE ELLIPTA) 62.5 MCG/INH 1 puff (1 puff Inhalation Given 05/02/18 0906)  MEDLINE mouth rinse (has no administration in time range)  chlorhexidine (PERIDEX) 0.12 % solution 15 mL (has no administration in time range)  ondansetron (ZOFRAN) injection 4 mg (4 mg Intravenous Given 05/01/18 1458)  sodium chloride 0.9 % bolus 500 mL (0 mLs Intravenous Stopped 05/01/18 1703)  ipratropium-albuterol (DUONEB) 0.5-2.5 (3) MG/3ML nebulizer solution 3 mL (3 mLs Nebulization Given  05/01/18 1517)    Note:  This document was prepared using Dragon voice recognition software and may include unintentional dictation errors.  Nanda Quinton, MD Emergency Medicine    Brieann Osinski, [facility], MD 05/02/18 [phone]

## 2018-05-02 ENCOUNTER — Encounter (HOSPITAL_COMMUNITY): Payer: Self-pay

## 2018-05-02 ENCOUNTER — Other Ambulatory Visit: Payer: Self-pay

## 2018-05-02 DIAGNOSIS — J439 Emphysema, unspecified: Secondary | ICD-10-CM

## 2018-05-02 DIAGNOSIS — T68XXXA Hypothermia, initial encounter: Secondary | ICD-10-CM

## 2018-05-02 DIAGNOSIS — R4182 Altered mental status, unspecified: Secondary | ICD-10-CM

## 2018-05-02 DIAGNOSIS — Z87891 Personal history of nicotine dependence: Secondary | ICD-10-CM

## 2018-05-02 DIAGNOSIS — R42 Dizziness and giddiness: Secondary | ICD-10-CM | POA: Diagnosis not present

## 2018-05-02 DIAGNOSIS — I2510 Atherosclerotic heart disease of native coronary artery without angina pectoris: Secondary | ICD-10-CM | POA: Diagnosis not present

## 2018-05-02 DIAGNOSIS — C099 Malignant neoplasm of tonsil, unspecified: Secondary | ICD-10-CM

## 2018-05-02 DIAGNOSIS — Z931 Gastrostomy status: Secondary | ICD-10-CM | POA: Diagnosis not present

## 2018-05-02 DIAGNOSIS — R680 Hypothermia, not associated with low environmental temperature: Secondary | ICD-10-CM | POA: Diagnosis not present

## 2018-05-02 DIAGNOSIS — K219 Gastro-esophageal reflux disease without esophagitis: Secondary | ICD-10-CM

## 2018-05-02 DIAGNOSIS — E7800 Pure hypercholesterolemia, unspecified: Secondary | ICD-10-CM

## 2018-05-02 DIAGNOSIS — E039 Hypothyroidism, unspecified: Secondary | ICD-10-CM | POA: Diagnosis not present

## 2018-05-02 LAB — BASIC METABOLIC PANEL
Anion gap: 8 (ref 5–15)
BUN: 13 mg/dL (ref 8–23)
CO2: 26 mmol/L (ref 22–32)
Calcium: 8.9 mg/dL (ref 8.9–10.3)
Chloride: 104 mmol/L (ref 98–111)
Creatinine, Ser: 0.81 mg/dL (ref 0.61–1.24)
GFR calc Af Amer: >60 mL/min (ref >60)
GFR calc non Af Amer: >60 mL/min (ref >60)
Glucose, Bld: 83 mg/dL (ref 70–99)
Potassium: 3.7 mmol/L (ref 3.5–5.1)
Sodium: 138 mmol/L (ref 135–145)

## 2018-05-02 LAB — CBC
HEMATOCRIT: 37.6 % — AB (ref 39.0–52.0)
HEMOGLOBIN: 11.9 g/dL — AB (ref 13.0–17.0)
MCH: 30 pg (ref 26.0–34.0)
MCHC: 31.6 g/dL (ref 30.0–36.0)
MCV: 94.7 fL (ref 80.0–100.0)
Platelets: 146 10*3/uL — ABNORMAL LOW (ref 150–400)
RBC: 3.97 MIL/uL — ABNORMAL LOW (ref 4.22–5.81)
RDW: 13.1 % (ref 11.5–15.5)
WBC: 8.7 10*3/uL (ref 4.0–10.5)
nRBC: 0 % (ref 0.0–0.2)

## 2018-05-02 LAB — MRSA PCR SCREENING: MRSA by PCR: NEGATIVE

## 2018-05-02 LAB — GLUCOSE, CAPILLARY
Glucose-Capillary: 99 mg/dL (ref 70–99)
Glucose-Capillary: 165 mg/dL — ABNORMAL HIGH (ref 70–99)

## 2018-05-02 LAB — TROPONIN I: Troponin I: <0.03 ng/mL (ref <0.03)

## 2018-05-02 LAB — HIV ANTIBODY (ROUTINE TESTING W REFLEX): HIV Screen 4th Generation wRfx: NONREACTIVE

## 2018-05-02 MED ORDER — CHLORHEXIDINE GLUCONATE 0.12 % MT SOLN: 15.0000 mL | Freq: Two times a day (BID) | OROMUCOSAL | Status: DC

## 2018-05-02 MED ORDER — ORAL CARE MOUTH RINSE
15.0000 mL | Freq: Two times a day (BID) | OROMUCOSAL | Status: DC
  Administered 2018-05-02 (×2): 15 mL via OROMUCOSAL

## 2018-05-02 NOTE — Discharge Summary (Addendum)
Physician Discharge Summary  Nathaniel Wells QMV:[id number] DOB: 07-13-1950 DOA: 05/01/2018  PCP: Deborah Chalk, FNP  Admit date: 05/01/2018 Discharge date: 05/02/2018  Admitted From: Home Disposition: Home   Recommendations for Outpatient Follow-up:  1. Follow up with PCP in 1-2 weeks. Strict return precautions advised. 2. Please follow up on the following pending results: Blood cultures (no growth x24 hours). 3. Pleural thickening noted on CXR at admission (see report below and image in PACS). Unclear significance. Radiology recommends follow up. This was shared with the patient and should be addressed by PCP at follow up.  Home Health: None Equipment/Devices: None Discharge Condition: Stable CODE STATUS: Full Diet recommendation: Heart healthy  Brief/Interim Summary: Nathaniel Wells is a 67 y.o. 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 After feeling no evidence of sickness that morning, he drove to pick up his daughter who came to his parked car and found him laying back in the driver's seat, complaining of these symptoms and malaise that developed during the car ride. He had glossy eyes but remained slowly responsive throughout. No seizure-like activity was noted. She brought him to the ED for evaluation where he was complaining of chills found to have hypothermia (rectal 96.26F, intermittent mild bradycardia 48-70bpm, normal respiratory rate and BP, no hypoxia. Dizziness, nausea and vomiting had resolved. Labs were unremarkable, head CT and CXR both unremarkable. Bair hugger was placed with improvement in hypothermia and AMS and he was brought into the hospital after blood cultures were drawn.  He states that  about every 6 months for the last 10 or 15 years he gets a spell of vertigo that lasts anywhere from 2 to 6 hours that is relieved with lying in bed and resting. Previous providers attributed this vertigo and sensorineural hearing loss bilaterally with additional conductive hearing loss on left and eustachian tube dysfunction on the right to prior radiation therapy. This felt similar to those episodes but with the addition of shortness of breath. Prior to this episodes he has had no fevers, chills, increased cough, sputum, chest pain, abdominal pain, nausea, vomiting, diarrhea, dysuria, urinary frequency or hesitancy, rashes, myalgias, arthralgias or sick contacts. Smokes marijuana daily since CA Dx without recent change.   Further evaluation was largely negative. Troponins negative x3, ECG nonischemic, flu negative, HIV NR, lactic acid wnl x2, WBC up to 13.7k but back down to 8.7 on recheck the same day despite no intervention. Platelets stable at 149. TSH 3.554, free Tr 0.89. Salicyclates undetectable and UDS positive only for THC. As above head CT showed no acute findings. No infiltrate on CXR, infected wounds on exam or abnormality on urinalysis. Blood cultures have remained negative and the patient's vital signs have normalized without continued interventions. He feels well and requests discharge. He lives with his daughters who are at bedside and extremely attuned to his condition. Return precautions advised in detail, and I will watch the blood cultures, but he does not require any further inpatient work up or treatment and is stable for discharge.  Discharge Diagnoses:  Active Problems:   Atherosclerosis of native coronary artery of native heart without angina pectoris   Emphysema of lung ([place])   Feeding by G-tube ([place])   Gastroesophageal reflux disease without esophagitis   Former heavy  tobacco smoker   Squamous cell carcinoma of right tonsil ([facility])   Hypercholesterolemia   Vertigo    Hypothermia   AMS (altered mental status)   Hypothermia: Mild, unclear etiology but no other convincing evidence of sepsis. No longer on immunosuppressive therapy. ?Due to transient hypoglycemia that was not detected here, ?possibly related to exposure on cold day in car for prolonged period of time.  - As above, work up negative, blood cultures negative.   Vertigo: likely related to his chronic eustachian tube dysfunction, question Meniere's given concomitant nausea and h/o SNHL. This episode is very similar to his prior episodes but he states was more severe. His daughter report increasing anxiety lately, so ?if episode began as usual and triggered a panic attack since work up has been negative. Has had negative EEG's in the past with these episodes, and doubt this would be high yield now. - Continue meclizine prn - Audiology and neurology follow up recommended.   CAD: stable. No evidence of ACS. Tropnin negative x 3. EKG no ischemia - Continue home medications  Hypothyroidism: TSH and direct T4 wnl.  - Continue synthroid  COPD - No wheezing or hypoxia on exam. Continue home medications   Hyperlipidemia:  - Continue statin  History of squamous cell carcinoma of the tonsil: s/p chemo, XRT not currently undergoing therapy. G-tube dependent but able to take liquids po.  - Continue tube feedings.  - Follow up with SLP. Consideration of aspiration PNA and/or pneumonitis, though no infiltrate or other respiratory symptoms.   GERD:  - Continue PPI  Discharge Instructions Discharge Instructions    Diet - low sodium heart healthy   Complete by:  As directed    Discharge instructions   Complete by:  As directed    You were admitted for hypothermia in the setting of a vertigo episode and associated shortness of breath. An exhaustive work up has been negative and your symptoms have resolved so you are stable for discharge.  - I will follow the blood culture results and contact you if  further treatment is necessary.  - If you develop any new symptoms, including but not limited to fever, chills, shortness of breath, chest pain, abdominal pain, painful urination, change in cough, new rash or joint aches, seek medical advise right away.   Increase activity slowly   Complete by:  As directed      Allergies as of 05/02/2018   No Known Allergies     Medication List    TAKE these medications   albuterol 108 (90 Base) MCG/ACT inhaler Commonly known as:  PROVENTIL HFA;VENTOLIN HFA Inhale 2 puffs into the lungs every 4 (four) hours as needed for wheezing or shortness of breath.   aspirin 81 MG chewable tablet Chew 81 mg by mouth daily.   esomeprazole 40 MG capsule Commonly known as:  NEXIUM Take 40 mg by mouth 2 (two) times daily.   fluocinonide 0.05 % external solution Commonly known as:  LIDEX Apply 1 application topically 2 (two) times daily as needed. For flare ups   levothyroxine 25 MCG tablet Commonly known as:  SYNTHROID, LEVOTHROID Take 25 mcg by mouth daily.   multivitamin with minerals Tabs tablet Take 1 tablet by mouth daily.   pilocarpine 7.5 MG tablet Commonly known as:  SALAGEN Take 7.5 mg by mouth 3 (three) times daily.   simvastatin 40 MG tablet Commonly known as:  ZOCOR Take 40 mg by mouth every evening.   tiotropium 18 MCG inhalation capsule Commonly  known as:  SPIRIVA Place 18 mcg into inhaler and inhale daily.      Follow-up Information    Deborah Chalk, FNP. Schedule an appointment as soon as possible for a visit in 1 week(s).   Specialty:  Nurse Practitioner Contact information: [address] [place] [address] [phone]          No Known Allergies  Consultations:  None  Procedures/Studies: Dg Chest 2 View  Result Date: 05/01/2018 CLINICAL DATA:  Shortness of breath and dizziness. EXAM: CHEST - 2 VIEW COMPARISON:  None. FINDINGS: Cardiomediastinal silhouette is normal. Mediastinal contours  appear intact. Calcific atherosclerotic disease and tortuosity of the aorta. There is no evidence of focal airspace consolidation, pleural effusion or pneumothorax. Biapical subpleural thickening. Osseous structures are without acute abnormality. Soft tissues are grossly normal. IMPRESSION: No evidence of pulmonary edema or infiltrate. Biapical subpleural thickening, right greater than left. Given patient's history of smoking, further evaluation with CT of the chest may be considered on a nonemergent basis. Calcific atherosclerotic disease of the aorta. Electronically Signed   By: Fidela Salisbury M.D.   On: 05/01/2018 15:51   Ct Head Wo Contrast  Result Date: 05/01/2018 CLINICAL DATA:  Altered mental status, dizziness. EXAM: CT HEAD WITHOUT CONTRAST TECHNIQUE: Contiguous axial images were obtained from the base of the skull through the vertex without intravenous contrast. COMPARISON:  None. FINDINGS: Brain: There is mild generalized parenchymal volume loss with commensurate dilatation of the ventricles and sulci. Mild chronic small vessel ischemic changes noted within the periventricular white matter regions bilaterally. There is no mass, hemorrhage, edema or other evidence of acute parenchymal abnormality. No extra-axial hemorrhage. Vascular: Chronic calcified atherosclerotic changes of the large vessels at the skull base. No unexpected hyperdense vessel. Skull: Normal. Negative for fracture or focal lesion. Sinuses/Orbits: No acute finding. Other: None. IMPRESSION: 1. No acute findings. No intracranial mass, hemorrhage or edema. 2. Mild chronic small vessel ischemic changes in the white matter. Electronically Signed   By: Franki Cabot M.D.   On: 05/01/2018 16:11    Subjective: Feels completely well. Has had normal mentation since being in the ED yesterday. No neurological deficits or confusion. No fevers, chills. Has felt comfortable off bair hugger since last night.  Discharge Exam: Vitals:    05/02/18 0908 05/02/18 1123  BP:  (!) 148/97  Pulse:  71  Resp:    Temp:  98.1 F (36.7 C)  SpO2: 96% 100%   General: Pt is alert, awake, not in acute distress Neck: No meningismus Cardiovascular: RRR, S1/S2 +, no rubs, no gallops Respiratory: Nonlabored, clear on room air Abdominal: Soft, NT, ND, bowel sounds +. G-tube site without spreading erythema, tenderness, discharge.  Extremities: No edema, no cyanosis Skin: Small telangiectasias on left cheek and nose. Healed remote excoriation on posterior scalp which is stable per pt and family. No new rashes, wounds or ulcers.   Labs: BNP (last 3 results) No results for input(s): BNP in the last 8760 hours. Basic Metabolic Panel: Recent Labs  Lab 05/01/18 1450 05/02/18 0214  NA 138 138  K 3.4* 3.7  CL 102 104  CO2 25 26  GLUCOSE 148* 83  BUN 17 13  CREATININE 0.86 0.81  CALCIUM 9.2 8.9   Liver Function Tests: Recent Labs  Lab 05/01/18 1450  AST 37  ALT 30  ALKPHOS 66  BILITOT 0.3  PROT 7.0  ALBUMIN 4.3   Recent Labs  Lab 05/01/18 1439  LIPASE 29  No results for input(s): AMMONIA in the last 168 hours. CBC: Recent Labs  Lab 05/01/18 1439 05/01/18 1749 05/02/18 1352  WBC 7.0 13.8* 8.7  HGB 12.8* 12.2* 11.9*  HCT 40.8 39.5 37.6*  MCV 95.6 99.0 94.7  PLT 168 149* 146*   Cardiac Enzymes: Recent Labs  Lab 05/01/18 1937 05/02/18 0214  TROPONINI <0.03 <0.03   BNP: Invalid input(s): POCBNP CBG: Recent Labs  Lab 05/01/18 2219 05/02/18 0836 05/02/18 1121  GLUCAP 96 99 165*   D-Dimer No results for input(s): DDIMER in the last 72 hours. Hgb A1c No results for input(s): HGBA1C in the last 72 hours. Lipid Profile No results for input(s): CHOL, HDL, LDLCALC, TRIG, CHOLHDL, LDLDIRECT in the last 72 hours. Thyroid function studies Recent Labs    05/01/18 1749  TSH 3.554   Anemia work up No results for input(s): VITAMINB12, FOLATE, FERRITIN, TIBC, IRON, RETICCTPCT in the last 72  hours. Urinalysis    Component Value Date/Time   COLORURINE YELLOW 05/01/2018 1511   APPEARANCEUR HAZY (A) 05/01/2018 1511   LABSPEC 1.013 05/01/2018 1511   PHURINE 8.0 05/01/2018 1511   GLUCOSEU NEGATIVE 05/01/2018 1511   HGBUR NEGATIVE 05/01/2018 1511   BILIRUBINUR NEGATIVE 05/01/2018 1511   KETONESUR NEGATIVE 05/01/2018 1511   PROTEINUR NEGATIVE 05/01/2018 1511   NITRITE NEGATIVE 05/01/2018 1511   LEUKOCYTESUR NEGATIVE 05/01/2018 1511    Microbiology Recent Results (from the past 240 hour(s))  Culture, blood (routine x 2)     Status: None (Preliminary result)   Collection Time: 05/01/18  1:25 PM  Result Value Ref Range Status   Specimen Description BLOOD RIGHT ANTECUBITAL  Final   Special Requests   Final    BOTTLES DRAWN AEROBIC AND ANAEROBIC Blood Culture results may not be optimal due to an inadequate volume of blood received in culture bottles   Culture   Final    NO GROWTH < 24 HOURS Performed at [facility], [place] [address] [place], [place] [address]    Report Status PENDING  Incomplete  Culture, blood (routine x 2)     Status: None (Preliminary result)   Collection Time: 05/01/18  4:50 PM  Result Value Ref Range Status   Specimen Description BLOOD RIGHT ARM  Final   Special Requests   Final    BOTTLES DRAWN AEROBIC AND ANAEROBIC Blood Culture adequate volume   Culture   Final    NO GROWTH < 24 HOURS Performed at [facility], [place] [address] [place], [place] [address]    Report Status PENDING  Incomplete  MRSA PCR Screening     Status: None   Collection Time: 05/01/18 10:12 PM  Result Value Ref Range Status   MRSA by PCR NEGATIVE NEGATIVE Final    Comment:        The GeneXpert MRSA Assay (FDA approved for NASAL specimens only), is one component of a comprehensive MRSA colonization surveillance program. It is not intended to diagnose MRSA infection nor to guide or monitor treatment for MRSA infections. Performed at [facility],  [place] [address] [place], [place] [address]     Time coordinating discharge: Approximately 40 minutes  Patrecia Pour, MD  [facility] 05/02/2018, 3:54 PM Pager [phone]

## 2018-05-02 NOTE — Progress Notes (Signed)
Patient received discharge information and acknowledged understanding of it. Patient IV was removed.  

## 2018-05-02 NOTE — Care Management CC44 (Signed)
Condition Code 44 Documentation Completed  Patient Details  Name: Nathaniel Wells MRN: [id number] Date of Birth: 04-18-1951   Condition Code 44 given:  Yes Patient signature on Condition Code 44 notice:  Yes Documentation of 2 MD's agreement:  Yes Code 44 added to claim:  Yes    Bethena Roys, RN 05/02/2018, 4:31 PM

## 2018-05-02 NOTE — Care Management Obs Status (Signed)
[facility] NOTIFICATION   Patient Details  Name: Nathaniel Wells MRN: [id number] Date of Birth: 06/21/1950   Medicare Observation Status Notification Given:  Yes    Bethena Roys, RN 05/02/2018, 4:31 PM

## 2018-05-05 LAB — VITAMIN B1: Vitamin B1 (Thiamine): 224.6 nmol/L — ABNORMAL HIGH (ref 66.5–200.0)

## 2018-05-06 LAB — CULTURE, BLOOD (ROUTINE X 2)
CULTURE: NO GROWTH
Culture: NO GROWTH
SPECIAL REQUESTS: ADEQUATE

## 2019-05-28 IMAGING — DX DG CHEST 2V
2 series · 2 of 2 positions shown · non-contrast
Comparison: None.

CLINICAL DATA: Shortness of breath and dizziness.

EXAM:
CHEST - 2 VIEW

[chest lat]
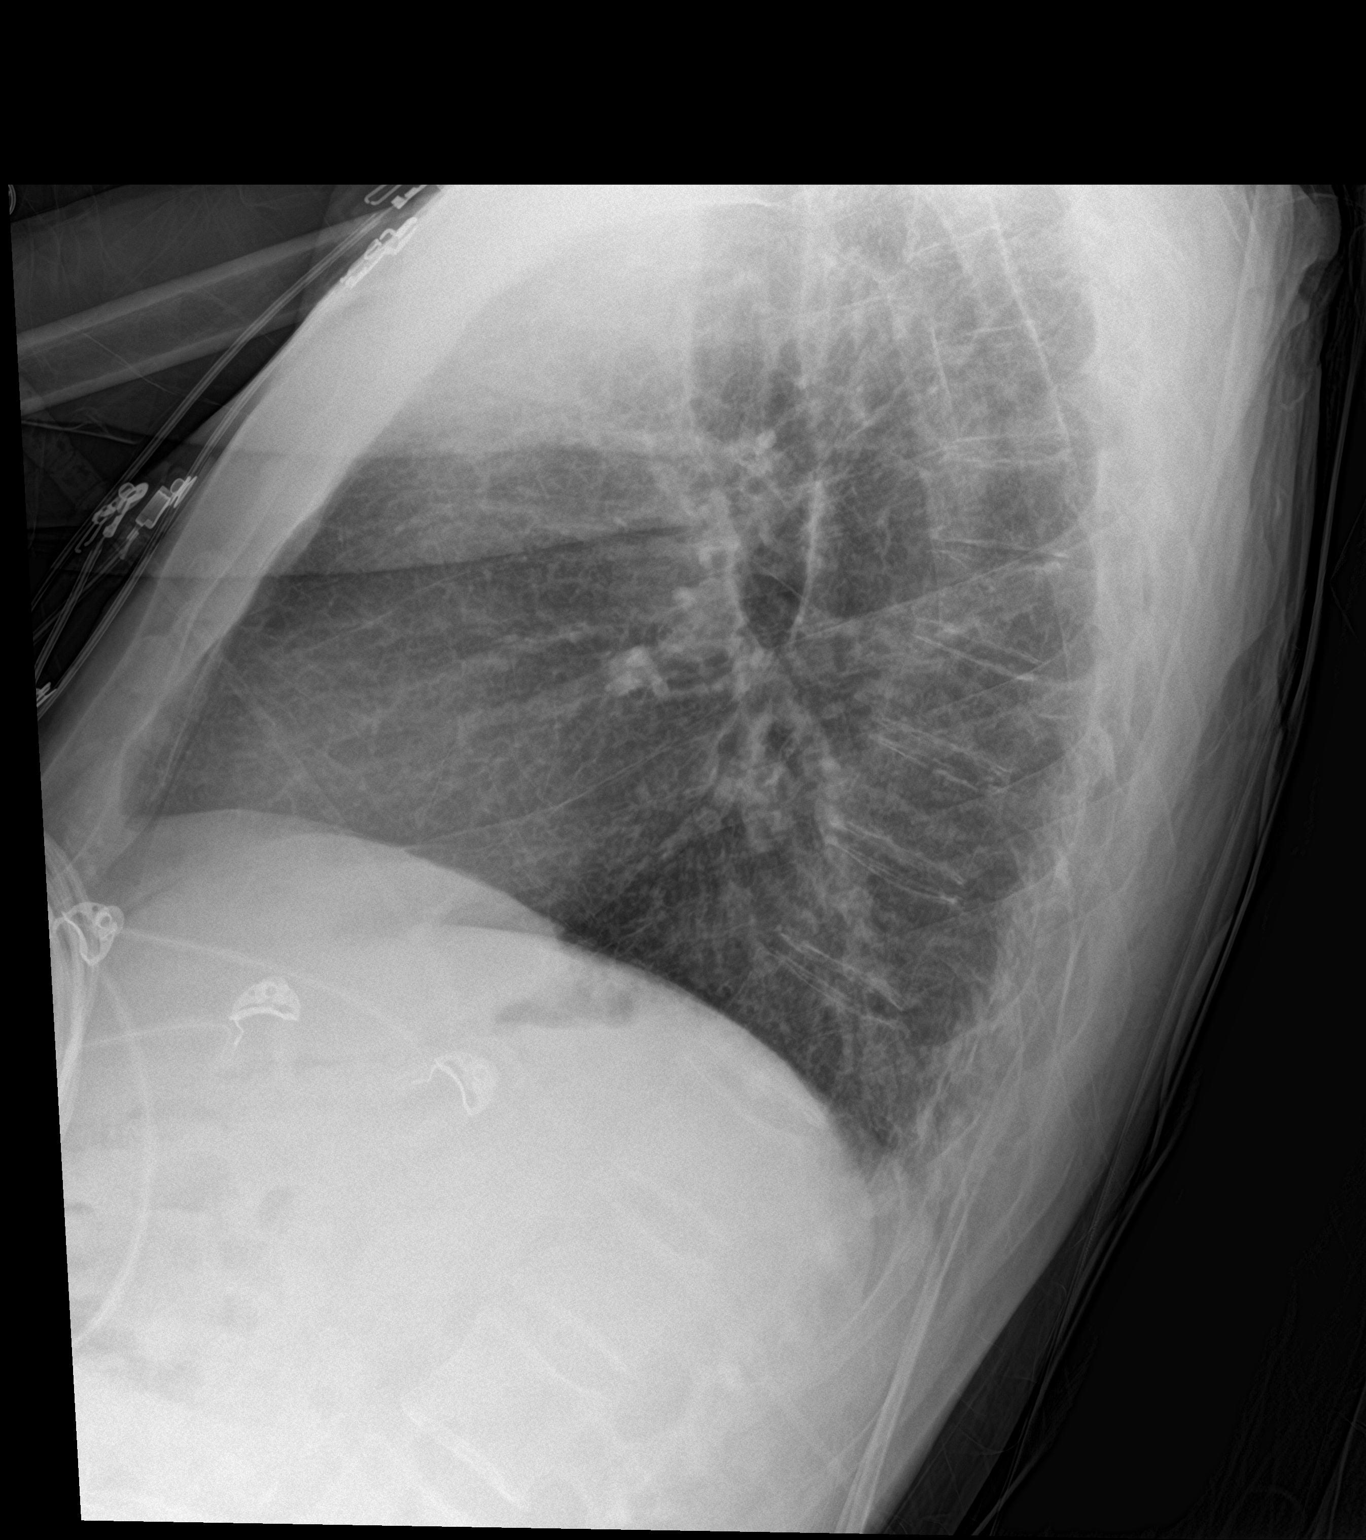

[chest ap]
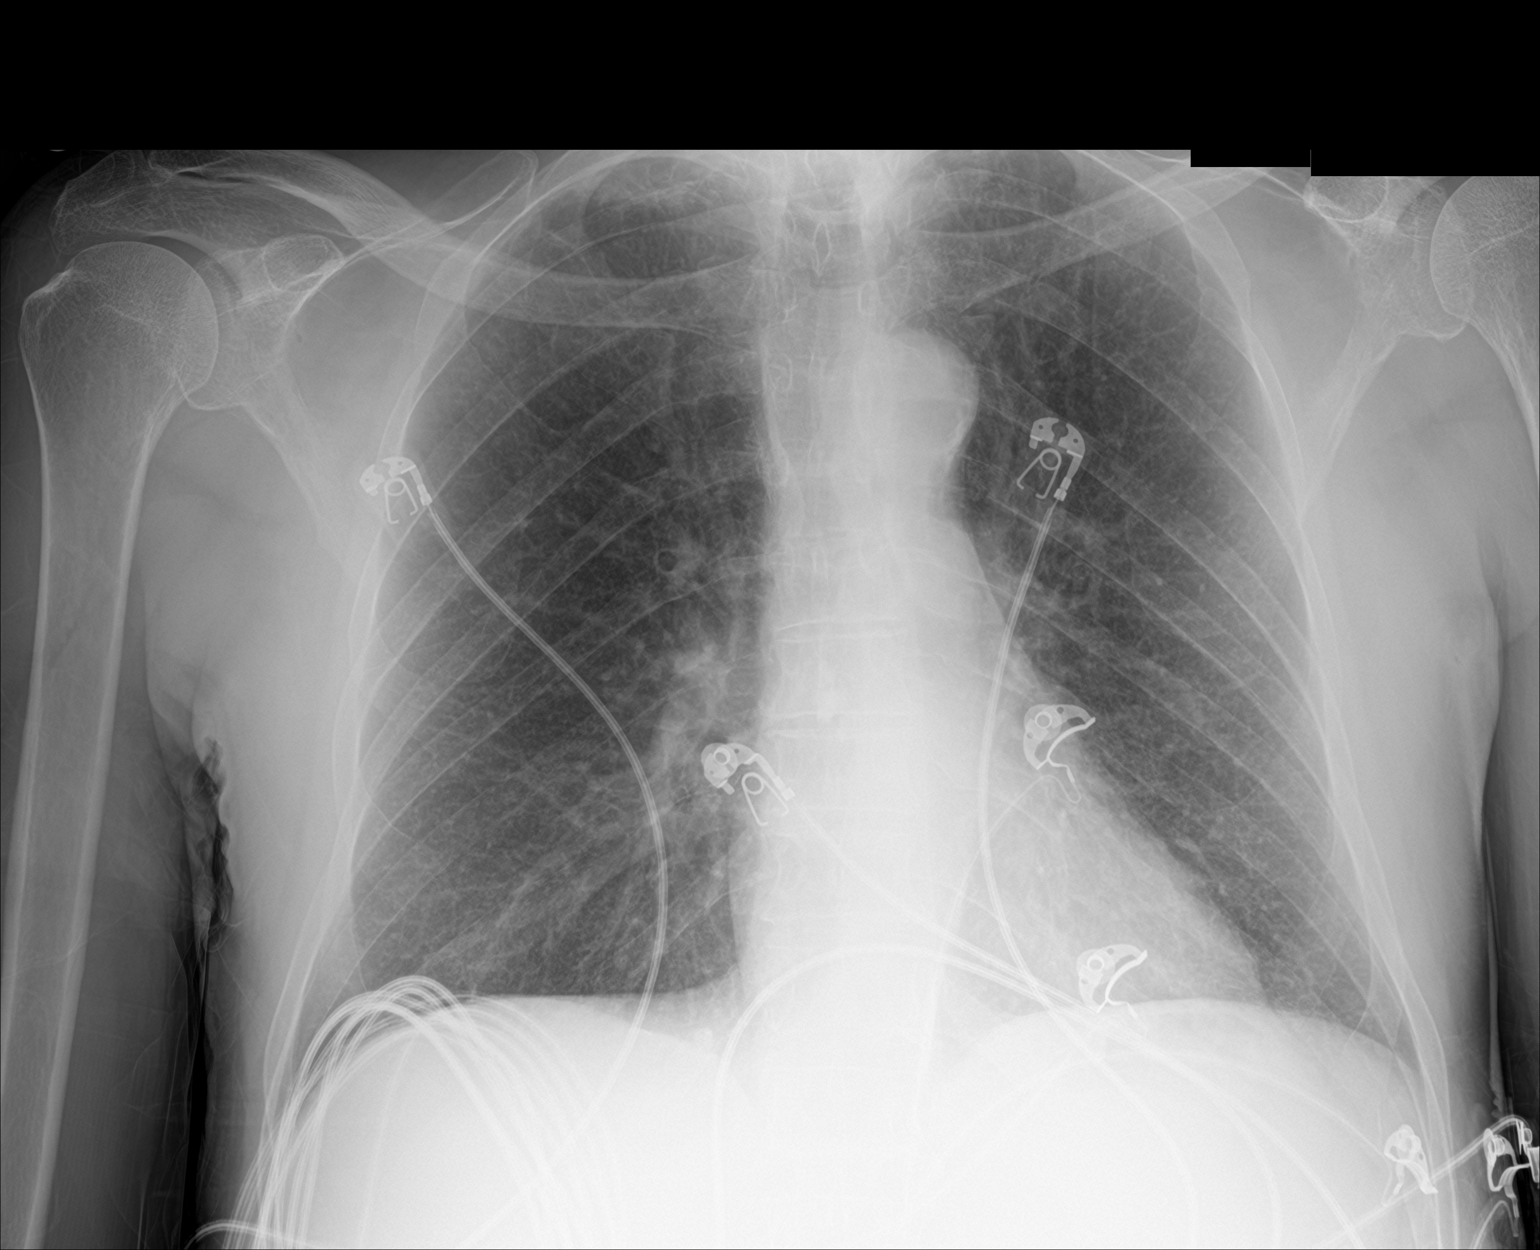

[2 of 2 positions shown; findings below may reference images not displayed]

FINDINGS: Cardiomediastinal silhouette is normal. Mediastinal contours appear
intact. Calcific atherosclerotic disease and tortuosity of the
aorta.

There is no evidence of focal airspace consolidation, pleural
effusion or pneumothorax. Biapical subpleural thickening.

Osseous structures are without acute abnormality. Soft tissues are
grossly normal.
IMPRESSION: No evidence of pulmonary edema or infiltrate.

Biapical subpleural thickening, right greater than left. Given
patient's history of smoking, further evaluation with CT of the
chest may be considered on a nonemergent basis.

Calcific atherosclerotic disease of the aorta.

## 2019-05-28 IMAGING — CT CT HEAD W/O CM
4 series · 16 of 47 positions shown, 18 images · non-contrast
Comparison: None.

CLINICAL DATA: Altered mental status, dizziness.

EXAM:
CT HEAD WITHOUT CONTRAST
TECHNIQUE: Contiguous axial images were obtained from the base of the skull
through the vertex without intravenous contrast.

[Series 3: head wo · axial · 0.42mm/px · z∈[-189,-69]mm · 7 of 33 slices shown, 9 images]
[im 5/33  brain]
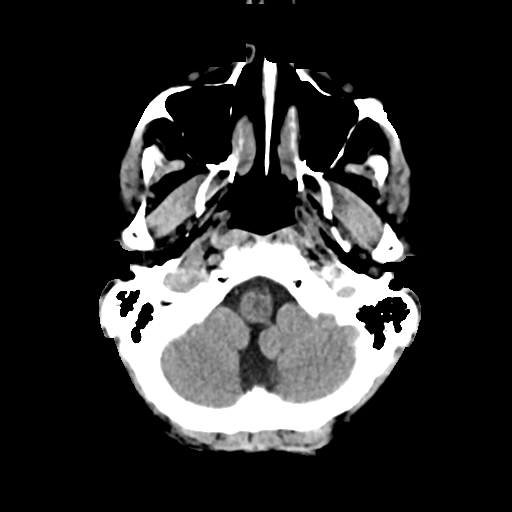
[im 5/33  bone]
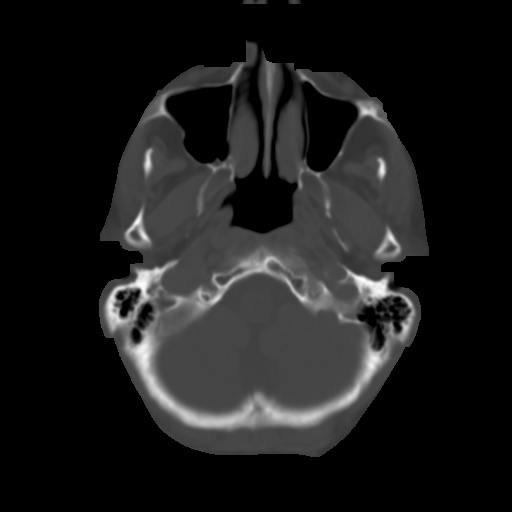
[im 9/33  brain]
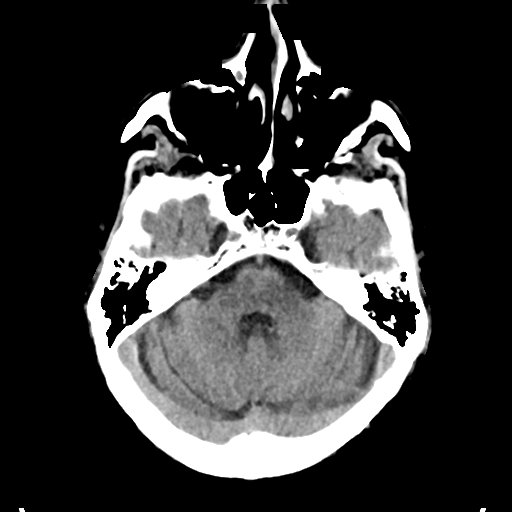
[im 13/33  brain]
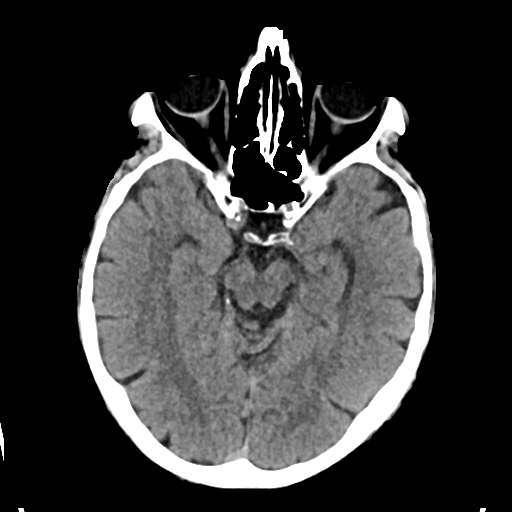
[im 17/33  brain]
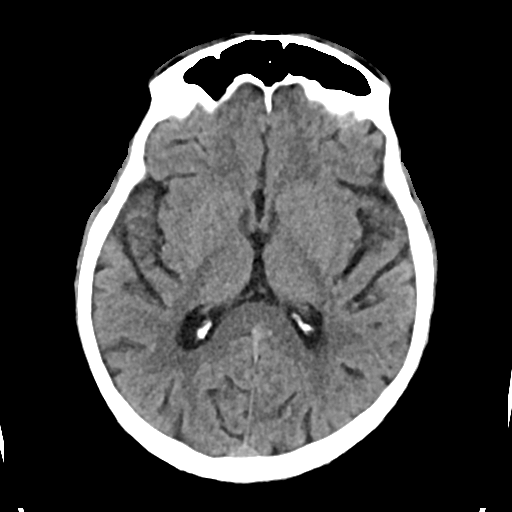
[im 21/33  brain]
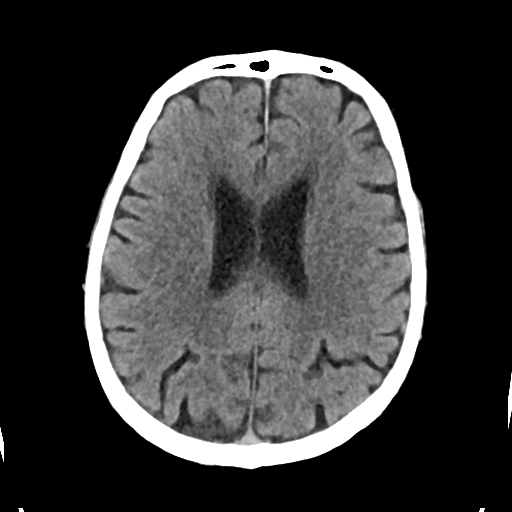
[im 21/33  bone]
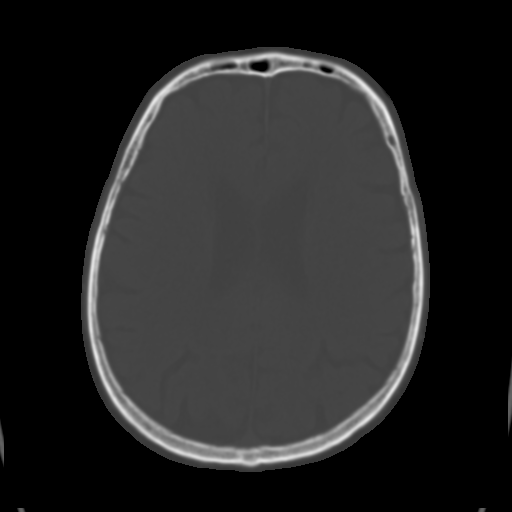
[im 25/33  brain]
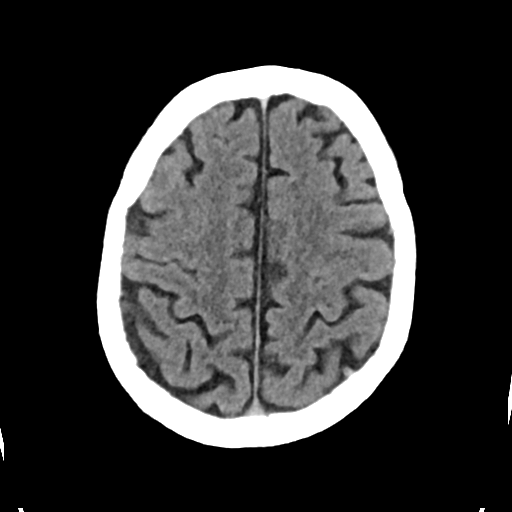
[im 29/33  brain]
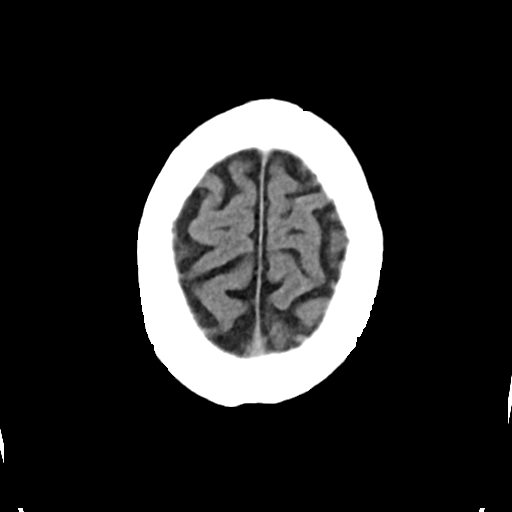

[Series 4: head bone · axial · 0.42mm/px · z∈[-193,-161]mm · 3 of 83 slices shown]
[im 9/83  bone]
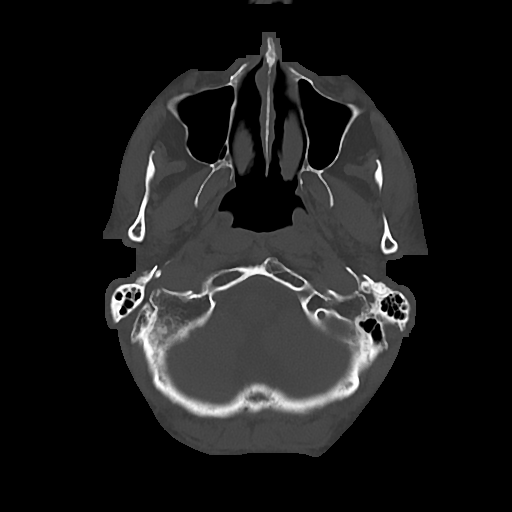
[im 17/83  bone]
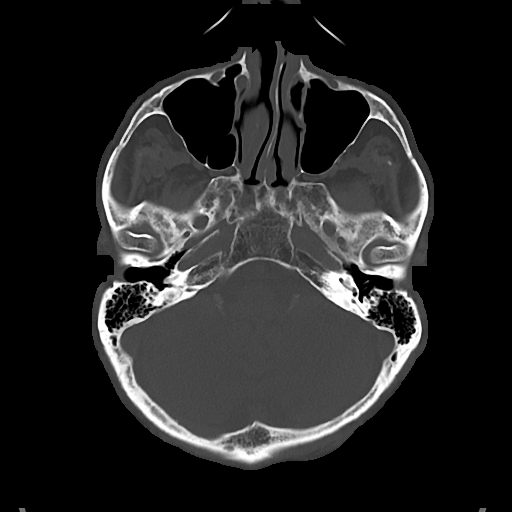
[im 25/83  bone]
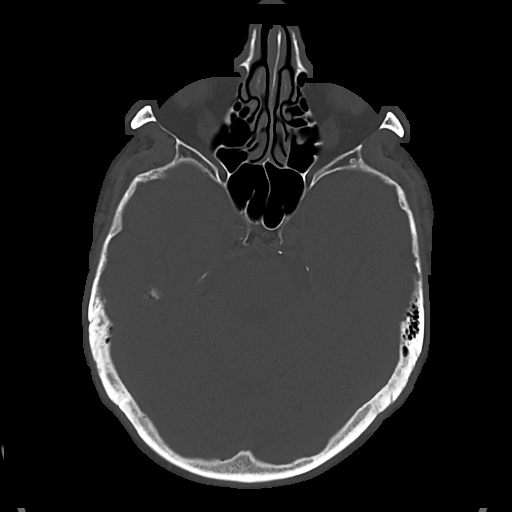

[Series 5: cor soft · coronal · 0.35mm/px · 3 of 66 slices shown]
[im 22/66  brain]
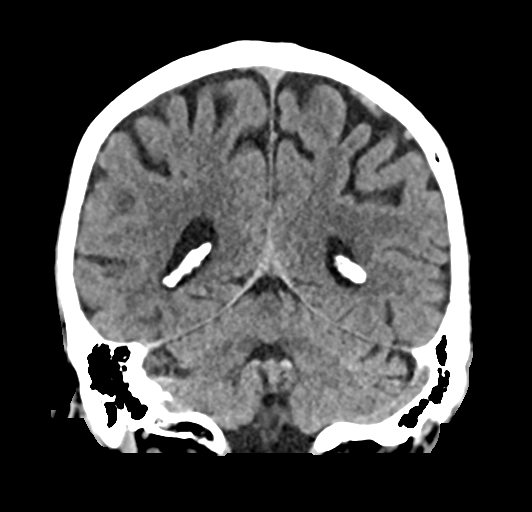
[im 29/66  brain]
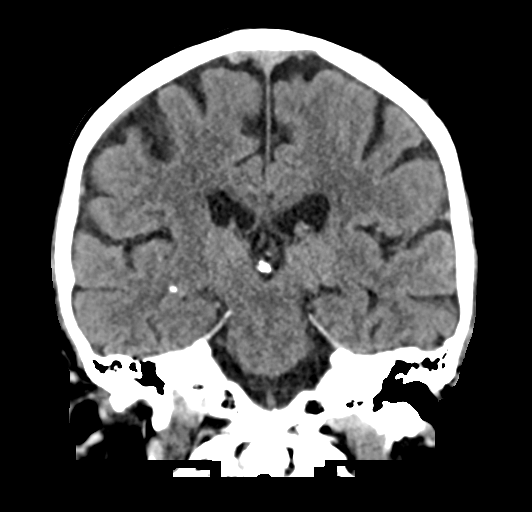
[im 37/66  brain]
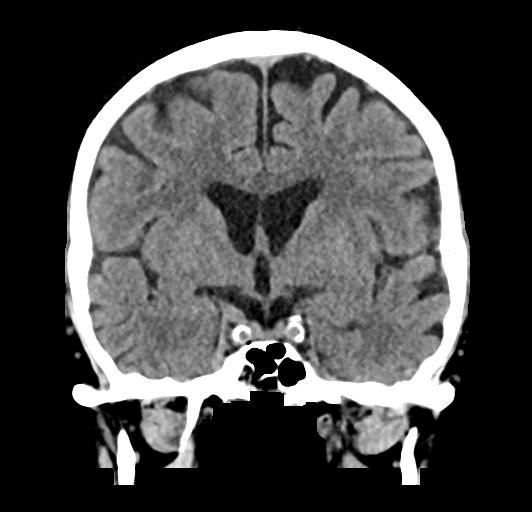

[Series 6: sag soft · sagittal · 0.35mm/px · 3 of 53 slices shown]
[im 18/53  brain]
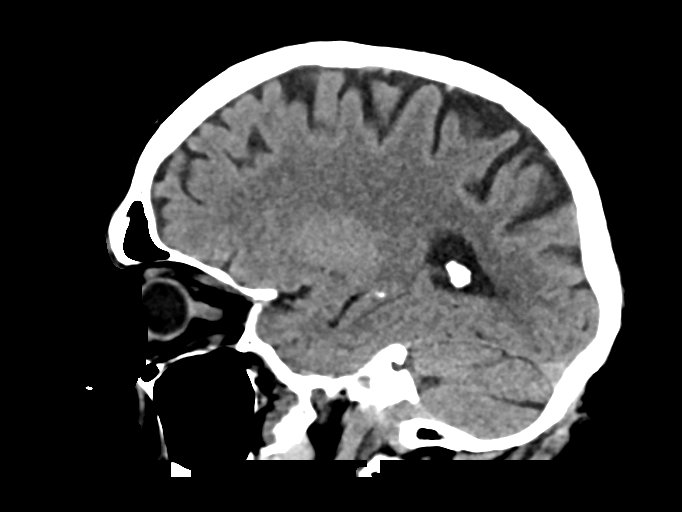
[im 27/53  brain]
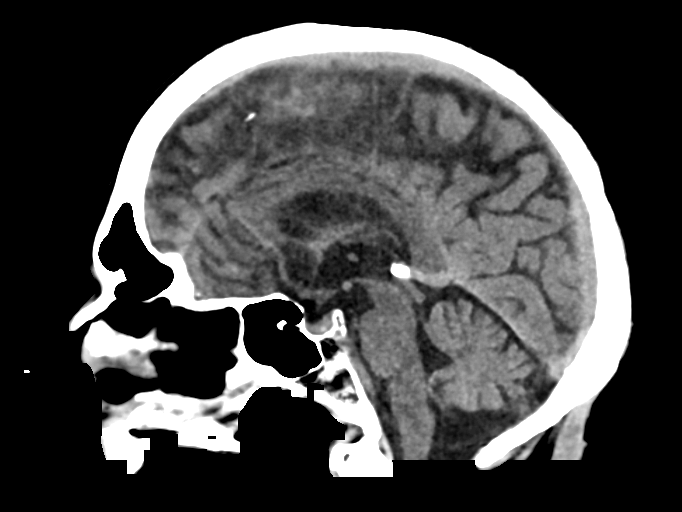
[im 35/53  brain]
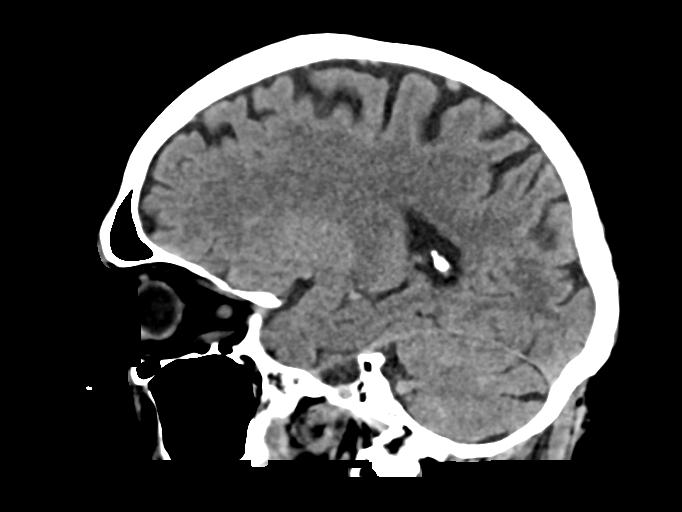

[16 of 47 positions shown; findings below may reference images not displayed]

FINDINGS: Brain: There is mild generalized parenchymal volume loss with
commensurate dilatation of the ventricles and sulci. Mild chronic
small vessel ischemic changes noted within the periventricular white
matter regions bilaterally.

There is no mass, hemorrhage, edema or other evidence of acute
parenchymal abnormality. No extra-axial hemorrhage.

Vascular: Chronic calcified atherosclerotic changes of the large
vessels at the skull base. No unexpected hyperdense vessel.

Skull: Normal. Negative for fracture or focal lesion.

Sinuses/Orbits: No acute finding.

Other: None.
IMPRESSION: 1. No acute findings. No intracranial mass, hemorrhage or edema.
2. Mild chronic small vessel ischemic changes in the white matter.

## 2019-07-14 ENCOUNTER — Ambulatory Visit: Payer: Medicare Other | Attending: Internal Medicine

## 2019-07-14 DIAGNOSIS — Z23 Encounter for immunization: Secondary | ICD-10-CM | POA: Insufficient documentation

## 2019-07-14 NOTE — Progress Notes (Signed)
   Covid-19 Vaccination Clinic  Name:  Ruslan Brotman    MRN: [id number] DOB: 1951/02/16  07/14/2019  Mr. Eggum was observed post Covid-19 immunization for 15 minutes without incidence. He was provided with Vaccine Information Sheet and instruction to access the V-Safe system.   Mr. Chessman was instructed to call 911 with any severe reactions post vaccine: Marland Kitchen Difficulty breathing  . Swelling of your face and throat  . A fast heartbeat  . A bad rash all over your body  . Dizziness and weakness    Immunizations Administered    Name Date Dose VIS Date Route   Pfizer COVID-19 Vaccine 07/14/2019  6:52 PM 0.3 mL 05/18/2019 Intramuscular   Manufacturer: [place]   Lot: [id number]   [place]: [id number]

## 2019-08-08 ENCOUNTER — Ambulatory Visit: Payer: Medicare Other | Attending: Internal Medicine

## 2019-08-08 DIAGNOSIS — Z23 Encounter for immunization: Secondary | ICD-10-CM

## 2019-08-08 NOTE — Progress Notes (Signed)
   Covid-19 Vaccination Clinic  Name:  Nathaniel Wells    MRN: [id number] DOB: [birth year]  08/08/2019  Nathaniel Wells was observed post Covid-19 immunization for 15 minutes without incident. He was provided with Vaccine Information Sheet and instruction to access the V-Safe system.   Nathaniel Wells was instructed to call 911 with any severe reactions post vaccine: Marland Kitchen Difficulty breathing  . Swelling of face and throat  . A fast heartbeat  . A bad rash all over body  . Dizziness and weakness   Immunizations Administered    Name Date Dose VIS Date Route   Pfizer COVID-19 Vaccine 08/08/2019  3:25 PM 0.3 mL 05/18/2019 Intramuscular   Manufacturer: [place]   Lot: [id number]   [place]: [id number]

## 2023-06-02 ENCOUNTER — Emergency Department
Admission: EM | Admit: 2023-06-02 | Discharge: 2023-06-02 | Disposition: A | Discharge status: Home / Self Care | Payer: Medicare Other | Attending: Emergency Medicine | Admitting: Emergency Medicine

## 2023-06-02 ENCOUNTER — Encounter: Payer: Self-pay | Admitting: Emergency Medicine

## 2023-06-02 ENCOUNTER — Other Ambulatory Visit: Payer: Self-pay

## 2023-06-02 ENCOUNTER — Emergency Department: Payer: Medicare Other

## 2023-06-02 DIAGNOSIS — R1030 Lower abdominal pain, unspecified: Secondary | ICD-10-CM | POA: Insufficient documentation

## 2023-06-02 DIAGNOSIS — R030 Elevated blood-pressure reading, without diagnosis of hypertension: Secondary | ICD-10-CM | POA: Diagnosis not present

## 2023-06-02 LAB — CBC WITH DIFFERENTIAL/PLATELET
Abs Immature Granulocytes: 0.01 10*3/uL (ref 0.00–0.07)
Basophils Absolute: 0 10*3/uL (ref 0.0–0.1)
Basophils Relative: 1 %
Eosinophils Absolute: 0.2 10*3/uL (ref 0.0–0.5)
Eosinophils Relative: 4 %
HCT: 35.3 % — ABNORMAL LOW (ref 39.0–52.0)
Hemoglobin: 11.6 g/dL — ABNORMAL LOW (ref 13.0–17.0)
Immature Granulocytes: 0 %
Lymphocytes Relative: 22 %
Lymphs Abs: 1.1 10*3/uL (ref 0.7–4.0)
MCH: 29.5 pg (ref 26.0–34.0)
MCHC: 32.9 g/dL (ref 30.0–36.0)
MCV: 89.8 fL (ref 80.0–100.0)
Monocytes Absolute: 0.6 10*3/uL (ref 0.1–1.0)
Monocytes Relative: 11 %
Neutro Abs: 3.3 10*3/uL (ref 1.7–7.7)
Neutrophils Relative %: 62 %
Platelets: 175 10*3/uL (ref 150–400)
RBC: 3.93 MIL/uL — ABNORMAL LOW (ref 4.22–5.81)
RDW: 12.9 % (ref 11.5–15.5)
WBC: 5.3 10*3/uL (ref 4.0–10.5)
nRBC: 0 % (ref 0.0–0.2)

## 2023-06-02 LAB — URINALYSIS, ROUTINE W REFLEX MICROSCOPIC
Bacteria, UA: NONE SEEN
Bilirubin Urine: NEGATIVE
Glucose, UA: NEGATIVE mg/dL
Hgb urine dipstick: NEGATIVE
Ketones, ur: 5 mg/dL — AB
Leukocytes,Ua: NEGATIVE
Nitrite: NEGATIVE
Protein, ur: 30 mg/dL — AB
Specific Gravity, Urine: 1.028 (ref 1.005–1.030)
pH: 6 (ref 5.0–8.0)

## 2023-06-02 LAB — COMPREHENSIVE METABOLIC PANEL
ALT: 25 U/L (ref 0–44)
AST: 27 U/L (ref 15–41)
Albumin: 4.3 g/dL (ref 3.5–5.0)
Alkaline Phosphatase: 60 U/L (ref 38–126)
Anion gap: 9 (ref 5–15)
BUN: 25 mg/dL — ABNORMAL HIGH (ref 8–23)
CO2: 28 mmol/L (ref 22–32)
Calcium: 8.6 mg/dL — ABNORMAL LOW (ref 8.9–10.3)
Chloride: 101 mmol/L (ref 98–111)
Creatinine, Ser: 1 mg/dL (ref 0.61–1.24)
GFR, Estimated: >60 mL/min (ref >60)
Glucose, Bld: 97 mg/dL (ref 70–99)
Potassium: 3.5 mmol/L (ref 3.5–5.1)
Sodium: 138 mmol/L (ref 135–145)
Total Bilirubin: 0.4 mg/dL (ref <1.2)
Total Protein: 6.9 g/dL (ref 6.5–8.1)

## 2023-06-02 MED ORDER — IOHEXOL 300 MG/ML  SOLN
100.0000 mL | Freq: Once | INTRAMUSCULAR | Status: AC | PRN
  Administered 2023-06-02: 100 mL via INTRAVENOUS

## 2023-06-02 MED ORDER — TRAMADOL HCL 50 MG PO TABS
50.0000 mg | ORAL_TABLET | Freq: Once | ORAL | Status: AC
  Administered 2023-06-02: 50 mg via ORAL
  Filled 2023-06-02: qty 1

## 2023-06-02 MED ORDER — TRAMADOL HCL 50 MG PO TABS: 50.0000 mg | ORAL_TABLET | Freq: Two times a day (BID) | ORAL | 0 refills | Status: AC

## 2023-06-02 MED ORDER — AMLODIPINE BESYLATE 5 MG PO TABS: 5.0000 mg | ORAL_TABLET | Freq: Every day | ORAL | 0 refills | Status: AC

## 2023-06-02 NOTE — Discharge Instructions (Addendum)
Your exam, labs, and CT scan are overall reassuring at this time.  No evidence for your bilateral lower abdominal pain at this time.  No indication of an acute diverticulitis or abscess on this presentation.  Continue to monitor your blood pressure and consider starting the amlodipine if pressures continue to be elevated.  Follow with your primary provider as well as your GI specialist as scheduled.  Return to the ED if needed.

## 2023-06-02 NOTE — ED Provider Notes (Signed)
[facility] Emergency Department Provider Note     Event Date/Time   First MD Initiated Contact with Patient 06/02/23 1638     (approximate)   History   Groin Pain   HPI  Nathaniel Wells is a 72 y.o. male with a history of emphysema, GERD, and enlarged prostate, who presents to the ED companied by his adult daughters.  Patient would endorse 3 to 4 weeks of intermittent bilateral groin pain that he initially described as hip pain.  He been evaluated by his primary provider with plain films of the hip did not reveal any acute fracture or dislocation.  Patient did have a history last year of a peripheral diverticulitis requiring hospital admission.  Patient declined surgical intervention at that time.  He presents today with similar symptoms and concern his daughter is with a similar presentation.  Patient endorses normal bilateral bowel habits at this time.  Denies any fevers, cough, congestion.  Physical Exam   Triage Vital Signs: ED Triage Vitals  Encounter Vitals Group     BP 06/02/23 1415 (!) 152/54     Systolic BP Percentile --      Diastolic BP Percentile --      Pulse Rate 06/02/23 1415 70     Resp 06/02/23 1415 18     Temp 06/02/23 1415 97.7 F (36.5 C)     Temp Source 06/02/23 1415 Oral     SpO2 06/02/23 1415 97 %     Weight 06/02/23 1419 143 lb 4.8 oz (65 kg)     Height 06/02/23 1419 5\' 8"  (1.727 m)     Head Circumference --      Peak Flow --      Pain Score 06/02/23 1419 10     Pain Loc --      Pain Education --      Exclude from Growth Chart --     Most recent vital signs: Vitals:   06/02/23 1841 06/02/23 2000  BP: (!) 190/85 (!) 172/77  Pulse: (!) 55 (!) 56  Resp: 19 16  Temp: 97.7 F (36.5 C) 97.8 F (36.6 C)  SpO2: 96% 96%    General Awake, no distress. NAD HEENT NCAT. PERRL. EOMI. No rhinorrhea. Mucous membranes are moist.  CV:  Good peripheral perfusion. RRR RESP:  Normal effort. CTA ABD:  No distention.  Soft and  nontender.  Normoactive bowel sounds x 4.  No rebound, guarding, or rigidity noted. GU:  No inguinal fullness appreciated. MSK:  Normal active range of motion of the bilateral lower extremities.    ED Results / Procedures / Treatments   Labs (all labs ordered are listed, but only abnormal results are displayed) Labs Reviewed  URINALYSIS, ROUTINE W REFLEX MICROSCOPIC - Abnormal; Notable for the following components:      Result Value   Color, Urine AMBER (*)    APPearance HAZY (*)    Ketones, ur 5 (*)    Protein, ur 30 (*)    All other components within normal limits  COMPREHENSIVE METABOLIC PANEL - Abnormal; Notable for the following components:   BUN 25 (*)    Calcium 8.6 (*)    All other components within normal limits  CBC WITH DIFFERENTIAL/PLATELET - Abnormal; Notable for the following components:   RBC 3.93 (*)    Hemoglobin 11.6 (*)    HCT 35.3 (*)    All other components within normal limits    EKG   RADIOLOGY  I personally  viewed and evaluated these images as part of my medical decision making, as well as reviewing the written report by the radiologist.  ED Provider Interpretation: No acute findings  CT ABDOMEN PELVIS W CONTRAST Result Date: 06/02/2023 CLINICAL DATA:  Abdominal pain. Diverticulitis complication suspected. EXAM: CT ABDOMEN AND PELVIS WITH CONTRAST TECHNIQUE: Multidetector CT imaging of the abdomen and pelvis was performed using the standard protocol following bolus administration of intravenous contrast. RADIATION DOSE REDUCTION: This exam was performed according to the departmental dose-optimization program which includes automated exposure control, adjustment of the mA and/or kV according to patient size and/or use of iterative reconstruction technique. CONTRAST:  OMNIPAQUE IOHEXOL 300 MG/ML  SOLN COMPARISON:  None Available. FINDINGS: Lower chest: Small right lung base calcified granuloma. The visualized lung bases are otherwise clear. No  intra-abdominal free air or free fluid. Hepatobiliary: The liver is unremarkable. No biliary dilatation. The gallbladder is unremarkable. Pancreas: Unremarkable. No pancreatic ductal dilatation or surrounding inflammatory changes. Spleen: Normal in size without focal abnormality. Adrenals/Urinary Tract: The adrenal glands unremarkable. Small left renal pole cyst and additional subcentimeter hypodense lesion which is too small to characterize. There is no hydronephrosis on either side. There is symmetric enhancement and excretion of contrast by both kidneys. The visualized ureters and urinary bladder appear unremarkable. Stomach/Bowel: There is sigmoid diverticulosis without active inflammatory changes. There is moderate stool throughout the colon. There is a small hiatal hernia. There is no bowel obstruction or active inflammation. The appendix is normal. Vascular/Lymphatic: Moderate aortoiliac atherosclerotic disease. The IVC is unremarkable. No portal venous gas. There is no adenopathy. Reproductive: The prostate and seminal vesicles are grossly unremarkable. No pelvic mass. Other: Small fat containing umbilical hernia. Musculoskeletal: Osteopenia.  No acute osseous pathology. IMPRESSION: 1. No acute intra-abdominal or pelvic pathology. 2. Sigmoid diverticulosis. No bowel obstruction. Normal appendix. 3.  Aortic Atherosclerosis (ICD10-I70.0). Electronically Signed   By: Elgie Collard M.D.   On: 06/02/2023 19:06    PROCEDURES:  Critical Care performed: No  Procedures   MEDICATIONS ORDERED IN ED: Medications  iohexol (OMNIPAQUE) 300 MG/ML solution 100 mL (100 mLs Intravenous Contrast Given 06/02/23 1823)  traMADol (ULTRAM) tablet 50 mg (50 mg Oral Given 06/02/23 1941)     IMPRESSION / MDM / ASSESSMENT AND PLAN / ED COURSE  I reviewed the triage vital signs and the nursing notes.                              Differential diagnosis includes, but is not limited to, acute appendicitis, renal  colic, testicular torsion, urinary tract infection/pyelonephritis, prostatitis,  epididymitis, diverticulitis, small bowel obstruction or ileus, colitis, abdominal aortic aneurysm, gastroenteritis, hernia, etc.   Patient's presentation is most consistent with acute complicated illness / injury requiring diagnostic workup.  Patient's diagnosis is consistent with lower abdominal pain of unclear etiology.  Patient with reassuring exam and workup at this time.  His CT scan did not reveal any acute diverticulitis, colitis, or appendicitis.  No indication for his bilateral groin pain or discomfort.  Patient continued to endorse no pain in the ED, but his blood pressure, did show elevated systolic readings.  Patient allowed a dose of tramadol to be provided in the ED.  Blood pressures seem to be normalizing at time of discharge.  A prescription for amlodipine was provided however, his daughters are advised to continue to monitor and blood pressures determine if his elevated systolic readings were due to his pain  and hospital presentation.  Patient will be discharged home with prescriptions for Motrin pain and tramadol. Patient is to follow up with her [facility] provider as discussed, as needed or otherwise directed. Patient is given ED precautions to return to the ED for any worsening or new symptoms.   FINAL CLINICAL IMPRESSION(S) / ED DIAGNOSES   Final diagnoses:  Lower abdominal pain  Elevated blood pressure reading without diagnosis of hypertension     Rx / DC Orders   ED Discharge Orders          Ordered    amLODipine (NORVASC) 5 MG tablet  Daily        06/02/23 1939    traMADol (ULTRAM) 50 MG tablet  2 times daily        06/02/23 1941             Note:  This document was prepared using Dragon voice recognition software and may include unintentional dictation errors.    Lissa Hoard, PA-C 06/02/23 2330    Dionne Bucy, MD 06/02/23 [phone]

## 2023-06-02 NOTE — ED Notes (Signed)
See triage note  Presents with pain to bilateral groin area  States pain started about 2 weeks ago  Nathaniel Wells to see his PCP  had an x-ray of hip  Denies any specific hip pain

## 2023-06-02 NOTE — ED Triage Notes (Signed)
Pt here with groin pain x2 weeks. Pt states the pain is worse on his right side. Pt states the pain is intermittent. Pt denies any bleeding or burning with urination.

## 2024-05-31 ENCOUNTER — Emergency Department

## 2024-05-31 ENCOUNTER — Emergency Department
Admission: EM | Admit: 2024-05-31 | Discharge: 2024-05-31 | Disposition: A | Discharge status: Home / Self Care | Attending: Emergency Medicine | Admitting: Emergency Medicine

## 2024-05-31 ENCOUNTER — Other Ambulatory Visit: Payer: Self-pay

## 2024-05-31 DIAGNOSIS — W228XXA Striking against or struck by other objects, initial encounter: Secondary | ICD-10-CM | POA: Diagnosis not present

## 2024-05-31 DIAGNOSIS — Z85818 Personal history of malignant neoplasm of other sites of lip, oral cavity, and pharynx: Secondary | ICD-10-CM | POA: Insufficient documentation

## 2024-05-31 DIAGNOSIS — S2231XA Fracture of one rib, right side, initial encounter for closed fracture: Secondary | ICD-10-CM | POA: Insufficient documentation

## 2024-05-31 DIAGNOSIS — S299XXA Unspecified injury of thorax, initial encounter: Secondary | ICD-10-CM | POA: Diagnosis present

## 2024-05-31 LAB — CBC
HCT: 41.5 % (ref 39.0–52.0)
Hemoglobin: 13.4 g/dL (ref 13.0–17.0)
MCH: 28.9 pg (ref 26.0–34.0)
MCHC: 32.3 g/dL (ref 30.0–36.0)
MCV: 89.4 fL (ref 80.0–100.0)
Platelets: 210 K/uL (ref 150–400)
RBC: 4.64 MIL/uL (ref 4.22–5.81)
RDW: 13.5 % (ref 11.5–15.5)
WBC: 6.1 K/uL (ref 4.0–10.5)
nRBC: 0 % (ref 0.0–0.2)

## 2024-05-31 LAB — BASIC METABOLIC PANEL WITH GFR
Anion gap: 12 (ref 5–15)
BUN: 14 mg/dL (ref 8–23)
CO2: 24 mmol/L (ref 22–32)
Calcium: 9.2 mg/dL (ref 8.9–10.3)
Chloride: 102 mmol/L (ref 98–111)
Creatinine, Ser: 0.83 mg/dL (ref 0.61–1.24)
GFR, Estimated: >60 mL/min
Glucose, Bld: 109 mg/dL — ABNORMAL HIGH (ref 70–99)
Potassium: 4 mmol/L (ref 3.5–5.1)
Sodium: 139 mmol/L (ref 135–145)

## 2024-05-31 LAB — TROPONIN T, HIGH SENSITIVITY: Troponin T High Sensitivity: 16 ng/L (ref 0–19)

## 2024-05-31 MED ORDER — LIDOCAINE 5 % EX PTCH
1.0000 | MEDICATED_PATCH | CUTANEOUS | Status: DC
  Administered 2024-05-31: 1 via TRANSDERMAL
  Filled 2024-05-31: qty 1

## 2024-05-31 MED ORDER — HYDROCODONE-ACETAMINOPHEN 5-325 MG PO TABS: 1.0000 | ORAL_TABLET | Freq: Four times a day (QID) | ORAL | 0 refills | Status: AC | PRN

## 2024-05-31 MED ORDER — HYDROCODONE-ACETAMINOPHEN 5-325 MG PO TABS
1.0000 | ORAL_TABLET | Freq: Once | ORAL | Status: AC
  Administered 2024-05-31: 1 via ORAL
  Filled 2024-05-31: qty 1

## 2024-05-31 MED ORDER — LIDOCAINE 5 % EX PTCH: 1.0000 | MEDICATED_PATCH | CUTANEOUS | 0 refills | Status: AC

## 2024-05-31 NOTE — ED Provider Notes (Signed)
 "  [facility] Provider Note    Event Date/Time   First MD Initiated Contact with Patient 05/31/24 1115     (approximate)   History   Rib Injury and Chest Pain   HPI  Nathaniel Wells is a 73 y.o. male with PMH of GERD, squamous cell carcinoma of right tonsil, emphysema presents for evaluation of right sided rib pain.  Patient states he rolled out of bed and hit his side on a nightstand about 3 days ago.  He has had ongoing pain since then.  He reports it is painful to take a deep breath.  Patient states he had a rib fracture before and this feels the same.      Physical Exam   Triage Vital Signs: ED Triage Vitals  Encounter Vitals Group     BP 05/31/24 1103 (!) 206/74     Girls Systolic BP Percentile --      Girls Diastolic BP Percentile --      Boys Systolic BP Percentile --      Boys Diastolic BP Percentile --      Pulse Rate 05/31/24 1103 77     Resp 05/31/24 1103 20     Temp 05/31/24 1103 98.5 F (36.9 C)     Temp Source 05/31/24 1103 Oral     SpO2 05/31/24 1103 94 %     Weight 05/31/24 1100 140 lb (63.5 kg)     Height 05/31/24 1100 5' 9 (1.753 m)     Head Circumference --      Peak Flow --      Pain Score 05/31/24 1058 8     Pain Loc --      Pain Education --      Exclude from Growth Chart --     Most recent vital signs: Vitals:   05/31/24 1103 05/31/24 1228  BP: (!) 206/74 (!) 193/85  Pulse: 77   Resp: 20   Temp: 98.5 F (36.9 C)   SpO2: 94%    General: Awake, no distress.  CV:  Good peripheral perfusion.  RRR. Resp:  Normal effort.  CTAB. Abd:  No distention.  Other:  Tender to palpation over the right anterior ribs but no bruising   ED Results / Procedures / Treatments   Labs (all labs ordered are listed, but only abnormal results are displayed) Labs Reviewed  BASIC METABOLIC PANEL WITH GFR - Abnormal; Notable for the following components:      Result Value   Glucose, Bld 109 (*)    All other components within normal  limits  CBC  TROPONIN T, HIGH SENSITIVITY  TROPONIN T, HIGH SENSITIVITY     EKG  ED provider interpretation: Normal sinus rhythm  Vent. rate 75 BPM PR interval 142 ms QRS duration 60 ms QT/QTcB 374/417 ms P-R-T axes -79 80 97   RADIOLOGY  Right rib x-ray obtained, interpreted the images as well as reviewed the radiologist report which shows a possible nondisplaced fracture of the anterior right fifth rib without pneumothorax or pleural effusion.  There is also hyperexpansion with chronical interstitial coarsening.   PROCEDURES:  Critical Care performed: No  Procedures   MEDICATIONS ORDERED IN ED: Medications  lidocaine  (LIDODERM ) 5 % 1 patch (1 patch Transdermal Patch Applied 05/31/24 1235)  HYDROcodone -acetaminophen  (NORCO/VICODIN) 5-325 MG per tablet 1 tablet (1 tablet Oral Given 05/31/24 1235)     IMPRESSION / MDM / ASSESSMENT AND PLAN / ED COURSE  I reviewed the triage vital signs  and the nursing notes.                             73 year old male presents for evaluation of right sided rib pain.  Blood pressure is quite elevated, vital signs stable otherwise.  Patient uncomfortable on exam.  Differential diagnosis includes, but is not limited to, rib fracture, contusion, pneumothorax, pleural effusion.  Patient's presentation is most consistent with acute complicated illness / injury requiring diagnostic workup.  Cardiac workup done given the location of patient's pain although patient denies true chest pain.  CBC and BMP are unremarkable.  Troponin is not elevated.  EKG is normal.  Chest x-ray showed possible nondisplaced fracture of the anterior right fifth rib without pneumothorax or pleural effusion.  This corresponds with where patient is tender to palpation on exam so I do suspect it is fractured.  Will treat patient's pain with Norco and lidocaine  patch.  Has been taking Tylenol  and ibuprofen at home.  Patient's blood pressure is significantly elevated and  remained so when I rechecked it.  I believe this is secondary to pain so we will plan to treat with pain medication.  He does not report any symptoms consistent with hypertensive emergency. I will advise patient to follow up with his PCP regarding this.       FINAL CLINICAL IMPRESSION(S) / ED DIAGNOSES   Final diagnoses:  Closed fracture of one rib of right side, initial encounter     Rx / DC Orders   ED Discharge Orders          Ordered    lidocaine  (LIDODERM ) 5 %  Every 24 hours        05/31/24 1234    HYDROcodone -acetaminophen  (NORCO/VICODIN) 5-325 MG tablet  Every 6 hours PRN        05/31/24 1234             Note:  This document was prepared using Dragon voice recognition software and may include unintentional dictation errors.   Cleaster Tinnie [employer]OR, PA-C 05/31/24 1237    Arlander Charleston, MD 05/31/24 1247  "

## 2024-05-31 NOTE — Discharge Instructions (Addendum)
 Your EKG and blood work was normal today.  The chest x-ray showed a rib fracture to the right fifth rib.  This will heal on its own with time.  Please use the incentive spirometer at least 3 times a day, I recommend doing this at breakfast, lunch and dinner.  You can use it more frequently than this.  The more the better.  This is to help prevent development of pneumonia.  I have sent a strong pain medication called Norco to the pharmacy.  This medication can be taken every 4-6 hours as needed for severe or breakthrough pain.  This medication can cause dependency so only take if you are unable to control your pain with other medications.  It will make you sleepy so do not drive or operate heavy machinery after taking it.  Do not drink alcohol while taking this medication.  This medication can also cause constipation so please take an over-the-counter stool softener like MiraLAX or Colace while taking it.  This medication contains both hydrocodone  and acetaminophen .  Do not take Tylenol  at the same time.  You can take the Norco or Tylenol  but not both.  I have sent lidocaine  patches to the pharmacy.  These can be worn for 12 hours at a time.  After 12 hours please remove it for 12 hours before replacing.  You can take 650 mg of Tylenol  and 600 mg of ibuprofen every 6 hours as needed for pain. You can use ice, heat, muscle creams and other topical pain relievers as well.

## 2024-05-31 NOTE — ED Triage Notes (Signed)
 Pt to ED for R lateral rib pain after hitting side of night table since about 3 days ago. Pt states painful to breathe deeply. Has had rib fx before and this feels the same. Respirations are unlabored.
